# Patient Record
Sex: Female | Born: 1970 | Race: White | Hispanic: No | Marital: Married | Smoking: Never smoker
Health system: Southern US, Community
[De-identification: ages and names within clinical notes are randomized; demographics above are authoritative.]

## PROBLEM LIST (undated history)

## (undated) DIAGNOSIS — E785 Hyperlipidemia, unspecified: Secondary | ICD-10-CM

## (undated) DIAGNOSIS — M5416 Radiculopathy, lumbar region: Secondary | ICD-10-CM

## (undated) DIAGNOSIS — E539 Vitamin B deficiency, unspecified: Secondary | ICD-10-CM

## (undated) DIAGNOSIS — D649 Anemia, unspecified: Secondary | ICD-10-CM

## (undated) DIAGNOSIS — N809 Endometriosis, unspecified: Secondary | ICD-10-CM

## (undated) DIAGNOSIS — M2550 Pain in unspecified joint: Secondary | ICD-10-CM

## (undated) DIAGNOSIS — R9230 Dense breasts, unspecified: Secondary | ICD-10-CM

## (undated) DIAGNOSIS — R739 Hyperglycemia, unspecified: Secondary | ICD-10-CM

## (undated) DIAGNOSIS — M1990 Unspecified osteoarthritis, unspecified site: Secondary | ICD-10-CM

## (undated) DIAGNOSIS — N393 Stress incontinence (female) (male): Secondary | ICD-10-CM

## (undated) DIAGNOSIS — B019 Varicella without complication: Secondary | ICD-10-CM

## (undated) HISTORY — DX: Endometriosis, unspecified: N80.9

## (undated) HISTORY — PX: REFRACTIVE SURGERY: SHX103

## (undated) HISTORY — DX: Stress incontinence (female) (male): N39.3

## (undated) HISTORY — DX: Vitamin B deficiency, unspecified: E53.9

## (undated) HISTORY — DX: Pain in unspecified joint: M25.50

## (undated) HISTORY — DX: Varicella without complication: B01.9

## (undated) HISTORY — DX: Anemia, unspecified: D64.9

## (undated) HISTORY — DX: Hyperlipidemia, unspecified: E78.5

## (undated) HISTORY — DX: Unspecified osteoarthritis, unspecified site: M19.90

## (undated) HISTORY — DX: Radiculopathy, lumbar region: M54.16

## (undated) HISTORY — PX: OTHER SURGICAL HISTORY: SHX169

## (undated) HISTORY — DX: Dense breasts, unspecified: R92.30

## (undated) HISTORY — DX: Hyperglycemia, unspecified: R73.9

## (undated) HISTORY — PX: SCLEROTHERAPY: SHX6841

## (undated) HISTORY — PX: ADENOIDECTOMY: SUR15

---

## 1998-04-11 ENCOUNTER — Other Ambulatory Visit: Admission: RE | Admit: 1998-04-11 | Discharge: 1998-04-11 | Payer: Self-pay | Admitting: *Deleted

## 1999-05-01 ENCOUNTER — Other Ambulatory Visit: Admission: RE | Admit: 1999-05-01 | Discharge: 1999-05-01 | Payer: Self-pay | Admitting: *Deleted

## 2000-04-30 ENCOUNTER — Other Ambulatory Visit: Admission: RE | Admit: 2000-04-30 | Discharge: 2000-04-30 | Payer: Self-pay | Admitting: *Deleted

## 2001-05-12 ENCOUNTER — Other Ambulatory Visit: Admission: RE | Admit: 2001-05-12 | Discharge: 2001-05-12 | Payer: Self-pay | Admitting: Obstetrics and Gynecology

## 2002-01-13 ENCOUNTER — Other Ambulatory Visit: Admission: RE | Admit: 2002-01-13 | Discharge: 2002-01-13 | Payer: Self-pay | Admitting: Obstetrics and Gynecology

## 2005-03-30 ENCOUNTER — Inpatient Hospital Stay (HOSPITAL_COMMUNITY): Admission: AD | Admit: 2005-03-30 | Discharge: 2005-04-01 | Payer: Self-pay | Admitting: Obstetrics and Gynecology

## 2005-04-02 ENCOUNTER — Encounter: Admission: RE | Admit: 2005-04-02 | Discharge: 2005-04-29 | Payer: Self-pay | Admitting: *Deleted

## 2005-04-30 ENCOUNTER — Encounter: Admission: RE | Admit: 2005-04-30 | Discharge: 2005-05-30 | Payer: Self-pay | Admitting: *Deleted

## 2005-05-31 ENCOUNTER — Encounter: Admission: RE | Admit: 2005-05-31 | Discharge: 2005-06-29 | Payer: Self-pay | Admitting: *Deleted

## 2005-06-30 ENCOUNTER — Encounter: Admission: RE | Admit: 2005-06-30 | Discharge: 2005-07-04 | Payer: Self-pay | Admitting: *Deleted

## 2005-09-26 ENCOUNTER — Ambulatory Visit: Payer: Self-pay | Admitting: Family Medicine

## 2005-10-01 ENCOUNTER — Ambulatory Visit: Payer: Self-pay | Admitting: Family Medicine

## 2014-07-28 ENCOUNTER — Ambulatory Visit: Payer: Self-pay | Admitting: Podiatry

## 2014-07-31 ENCOUNTER — Ambulatory Visit (INDEPENDENT_AMBULATORY_CARE_PROVIDER_SITE_OTHER): Payer: 59

## 2014-07-31 ENCOUNTER — Ambulatory Visit (INDEPENDENT_AMBULATORY_CARE_PROVIDER_SITE_OTHER): Payer: 59 | Admitting: Podiatry

## 2014-07-31 ENCOUNTER — Telehealth: Payer: Self-pay | Admitting: *Deleted

## 2014-07-31 VITALS — BP 122/78 | HR 74 | Resp 15

## 2014-07-31 DIAGNOSIS — M79672 Pain in left foot: Secondary | ICD-10-CM

## 2014-07-31 DIAGNOSIS — B079 Viral wart, unspecified: Secondary | ICD-10-CM | POA: Diagnosis not present

## 2014-07-31 DIAGNOSIS — B078 Other viral warts: Secondary | ICD-10-CM

## 2014-07-31 NOTE — Progress Notes (Signed)
Subjective:     Patient ID: Emily Weaver, female   DOB: 09-08-1970, 44 y.o.   MRN: [id number]  HPI patient has a painful lesion plantar aspect left foot that she thinks might be a foreign body and that she may have stepped on a splinter. States it's very tender and makes it hard for her to walk and it's been there several months and seems to feel like she's walking on a rock   Review of Systems  All other systems reviewed and are negative.      Objective:   Physical Exam  Constitutional: She is oriented to person, place, and time.  Cardiovascular: Intact distal pulses.   Musculoskeletal: Normal range of motion.  Neurological: She is oriented to person, place, and time.  Skin: Skin is warm.  Nursing note and vitals reviewed.  neurovascular status intact muscle strength adequate with range of motion subtalar midtarsal joint within normal limits. Patient's noted to have good digital perfusion is well oriented 3 and on the plantar aspect of the left foot there is a mass lying around the fourth metatarsal that upon debridement shows pinpoint bleeding pain to lateral pressure and measures approximately 1.1 cm x 7 mm.     Assessment:     Probable verruca plantaris plantar aspect left foot with possibility for porokeratotic lesion or plantar callus    Plan:     H&P and condition reviewed with patient. Due to the characteristic look I've recommended excision of mass and explained the risk of procedure and she wants to go ahead. I infiltrated with 60 mg of Xylocaine with epinephrine and after appropriate sterile prep of the area using sharp instrumentation I removed the lesion in toto and applied a small amount of phenol to the base to kill any remaining cells. I applied sterile dressing and did send off for pathology and will reappoint if any issues should occur but I explained will take several weeks to heal

## 2014-07-31 NOTE — Progress Notes (Signed)
   Subjective:    Patient ID: Emily Weaver, female    DOB: 1970-10-15, 44 y.o.   MRN: [id number]  HPI  Pt presents with left foot pain, presents as a plantar wart but pt thinks there is a foreign object in her foot. It is very painful and somewhat swollen, states this happened 1 month ago  Review of Systems  All other systems reviewed and are negative.      Objective:   Physical Exam        Assessment & Plan:

## 2014-07-31 NOTE — Telephone Encounter (Signed)
Left skin wedge sent to [facility] diagnosing definitive lesion.

## 2014-07-31 NOTE — Addendum Note (Signed)
Addended by: Harriett Sine D on: 07/31/2014 02:44 PM   Modules accepted: Orders

## 2014-07-31 NOTE — Patient Instructions (Signed)

## 2014-08-16 ENCOUNTER — Telehealth: Payer: Self-pay | Admitting: *Deleted

## 2014-08-16 NOTE — Telephone Encounter (Signed)
Dr. Paulla Dolly states pt's Bako biopsy of 07/31/2014 is a plantar wart.  I informed pt of results and instructed her to continue her aftercare until the area has a dry hard scab without redness, or drainage, and to call with concerns.

## 2014-08-29 ENCOUNTER — Encounter: Payer: Self-pay | Admitting: Podiatry

## 2016-03-19 ENCOUNTER — Other Ambulatory Visit: Payer: Self-pay | Admitting: Rheumatology

## 2016-03-19 DIAGNOSIS — M545 Low back pain: Secondary | ICD-10-CM

## 2016-03-28 ENCOUNTER — Other Ambulatory Visit: Payer: Self-pay | Admitting: Rheumatology

## 2016-03-28 ENCOUNTER — Ambulatory Visit
Admission: RE | Admit: 2016-03-28 | Discharge: 2016-03-28 | Disposition: A | Discharge status: Home / Self Care | Payer: 59 | Source: Ambulatory Visit | Attending: Rheumatology | Admitting: Rheumatology

## 2016-03-28 DIAGNOSIS — M545 Low back pain: Secondary | ICD-10-CM

## 2016-03-31 ENCOUNTER — Other Ambulatory Visit: Payer: Self-pay | Admitting: Obstetrics & Gynecology

## 2016-04-01 ENCOUNTER — Encounter: Payer: Self-pay | Admitting: Obstetrics & Gynecology

## 2016-04-02 ENCOUNTER — Other Ambulatory Visit: Payer: Self-pay | Admitting: Rheumatology

## 2016-04-17 ENCOUNTER — Ambulatory Visit
Admission: RE | Admit: 2016-04-17 | Discharge: 2016-04-17 | Disposition: A | Discharge status: Home / Self Care | Payer: 59 | Source: Ambulatory Visit | Attending: Rheumatology | Admitting: Rheumatology

## 2016-04-17 DIAGNOSIS — M545 Low back pain: Secondary | ICD-10-CM

## 2016-05-22 ENCOUNTER — Ambulatory Visit (INDEPENDENT_AMBULATORY_CARE_PROVIDER_SITE_OTHER): Payer: 59 | Admitting: Obstetrics & Gynecology

## 2016-05-22 ENCOUNTER — Encounter: Payer: Self-pay | Admitting: Obstetrics & Gynecology

## 2016-05-22 VITALS — BP 116/70 | Ht 64.0 in | Wt 160.0 lb

## 2016-05-22 DIAGNOSIS — N83299 Other ovarian cyst, unspecified side: Secondary | ICD-10-CM | POA: Diagnosis not present

## 2016-05-22 DIAGNOSIS — Z01818 Encounter for other preprocedural examination: Secondary | ICD-10-CM | POA: Diagnosis not present

## 2016-05-22 NOTE — Patient Instructions (Signed)
Preop visit today.  Planning Robotic Left Oophorectomy with Bilateral Salpingectomy.  Peritoneal washings.  Surgery/risks/post op recovery discussed.  Juliann Pulse will call you with Operative dates.

## 2016-05-22 NOTE — Progress Notes (Signed)
    Emily Weaver May 07, 1970 [id number]        46 y.o.  [name] on Mirena IUD presenting for Preop Lt complex Ovarian cyst.    Ovarian lesion was originally seen on an MRI 03/28/2016, done for lower back pain with radiation.  Possible Spinal stenosis.  Past medical history,surgical history, problem list, medications, allergies, family history and social history were all reviewed and documented in the EPIC chart.  Directed ROS with pertinent positives and negatives documented in the history of present illness/assessment and plan.  Exam:  Vitals:   05/22/16 0852  BP: 116/70  Weight: 160 lb (72.6 kg)  Height: 5\' 4"  (1.626 m)   General appearance:  Normal   Assessment/Plan:  46 y.o. [name] non menopausal with Mirena IUD in place, with a Complex Left Ovarian mass 4.9 cm.  Ova 1 low risk at 4.7, Ca125 slightly increased at 41.  Probable benign tumor, possible Endometrioma, but malignant process not completely ruled out.  Decision to proceed with Robotic Lt Oophorectomy, Bilateral Salpingectomy, removal of IUD and Peritoneal washings.                         Patient was counseled as to the risk of surgery to include the following:  1. Infection (prohylactic antibiotics will be administered)  2. DVT/Pulmonary Embolism (prophylactic pneumo compression stockings will be used)  3.Trauma to internal organs requiring additional surgical procedure to repair any injury to     Internal organs requiring perhaps additional hospitalization days.  4.Hemmorhage requiring transfusion and blood products which carry risks such as anaphylactic reaction, hepatitis and AIDS  Patient had received literature information on the procedure scheduled and all her questions were answered and fully accepts all risk.   Emily [id number]:17 AMTD

## 2016-06-10 ENCOUNTER — Encounter (HOSPITAL_COMMUNITY)
Admission: RE | Admit: 2016-06-10 | Discharge: 2016-06-10 | Disposition: A | Discharge status: Home / Self Care | Payer: 59 | Source: Ambulatory Visit | Attending: Obstetrics & Gynecology | Admitting: Obstetrics & Gynecology

## 2016-06-10 ENCOUNTER — Encounter (HOSPITAL_COMMUNITY): Payer: Self-pay

## 2016-06-10 DIAGNOSIS — Z01812 Encounter for preprocedural laboratory examination: Secondary | ICD-10-CM | POA: Insufficient documentation

## 2016-06-10 LAB — CBC
HEMATOCRIT: 39.5 % (ref 36.0–46.0)
Hemoglobin: 13.4 g/dL (ref 12.0–15.0)
MCH: 32.1 pg (ref 26.0–34.0)
MCHC: 33.9 g/dL (ref 30.0–36.0)
MCV: 94.5 fL (ref 78.0–100.0)
Platelets: 330 10*3/uL (ref 150–400)
RBC: 4.18 MIL/uL (ref 3.87–5.11)
RDW: 13.8 % (ref 11.5–15.5)
WBC: 9 10*3/uL (ref 4.0–10.5)

## 2016-06-10 LAB — TYPE AND SCREEN
ABO/RH(D): A POS
Antibody Screen: NEGATIVE

## 2016-06-10 LAB — ABO/RH: ABO/RH(D): A POS

## 2016-06-10 NOTE — Patient Instructions (Addendum)
Your procedure is scheduled on:  Friday, Jun 20, 2016  Enter through the [employer] [facility] at:  6:45 AM  Pick up the phone at the desk and dial [phone].  Call this number if you have problems the morning of surgery: [phone].  Remember: Do NOT eat food or drink after:  Midnight Thursday  Take these medicines the morning of surgery with a SIP OF WATER:  None  Stop ALL herbal medications at this time  Do NOT smoke the day of surgery.  Do NOT wear jewelry (body piercing), metal hair clips/bobby pins, make-up, or nail polish. Do NOT wear lotions, powders, or perfumes.  You may wear deodorant. Do NOT shave for 48 hours prior to surgery. Do NOT bring valuables to the hospital. Contacts, dentures, or bridgework may not be worn into surgery.  Have a responsible adult drive you home and stay with you for 24 hours after your procedure  Bring a copy of your healthcare power of attorney and living will documents.

## 2016-06-19 NOTE — Anesthesia Preprocedure Evaluation (Addendum)
Anesthesia Evaluation  Patient identified by MRN, date of birth, ID band Patient awake    Reviewed: Allergy & Precautions, NPO status , Patient's Chart, lab work & pertinent test results  History of Anesthesia Complications Negative for: history of anesthetic complications  Airway Mallampati: II  TM Distance: <3 FB Neck ROM: Full    Dental  (+) Teeth Intact, Dental Advisory Given   Pulmonary neg pulmonary ROS,    Pulmonary exam normal breath sounds clear to auscultation       Cardiovascular negative cardio ROS Normal cardiovascular exam Rhythm:Regular Rate:Normal     Neuro/Psych negative neurological ROS     GI/Hepatic negative GI ROS, Neg liver ROS,   Endo/Other  negative endocrine ROS  Renal/GU negative Renal ROS     Musculoskeletal negative musculoskeletal ROS (+)   Abdominal   Peds  Hematology negative hematology ROS (+)   Anesthesia Other Findings Day of surgery medications reviewed with the patient.  Reproductive/Obstetrics Complex Left Ovarian mass 4.9 cm                            Anesthesia Physical Anesthesia Plan  ASA: II  Anesthesia Plan: General   Post-op Pain Management:    Induction: Intravenous  Airway Management Planned: Oral ETT  Additional Equipment:   Intra-op Plan:   Post-operative Plan: Extubation in OR  Informed Consent: I have reviewed the patients History and Physical, chart, labs and discussed the procedure including the risks, benefits and alternatives for the proposed anesthesia with the patient or authorized representative who has indicated his/her understanding and acceptance.   Dental advisory given  Plan Discussed with: CRNA  Anesthesia Plan Comments:         Anesthesia Quick Evaluation

## 2016-06-20 ENCOUNTER — Encounter (HOSPITAL_COMMUNITY): Payer: Self-pay | Admitting: Anesthesiology

## 2016-06-20 ENCOUNTER — Ambulatory Visit (HOSPITAL_COMMUNITY): Payer: 59 | Admitting: Anesthesiology

## 2016-06-20 ENCOUNTER — Encounter (HOSPITAL_COMMUNITY)
Admission: RE | Disposition: A | Discharge status: Home / Self Care | Payer: Self-pay | Source: Ambulatory Visit | Attending: Obstetrics & Gynecology

## 2016-06-20 ENCOUNTER — Ambulatory Visit (HOSPITAL_COMMUNITY)
Admission: RE | Admit: 2016-06-20 | Discharge: 2016-06-20 | Disposition: A | Discharge status: Home / Self Care | Payer: 59 | Source: Ambulatory Visit | Attending: Obstetrics & Gynecology | Admitting: Obstetrics & Gynecology

## 2016-06-20 DIAGNOSIS — N83292 Other ovarian cyst, left side: Secondary | ICD-10-CM | POA: Diagnosis not present

## 2016-06-20 DIAGNOSIS — Z79899 Other long term (current) drug therapy: Secondary | ICD-10-CM | POA: Diagnosis not present

## 2016-06-20 DIAGNOSIS — Z302 Encounter for sterilization: Secondary | ICD-10-CM | POA: Insufficient documentation

## 2016-06-20 DIAGNOSIS — N802 Endometriosis of fallopian tube: Secondary | ICD-10-CM | POA: Diagnosis not present

## 2016-06-20 DIAGNOSIS — N83202 Unspecified ovarian cyst, left side: Secondary | ICD-10-CM | POA: Diagnosis present

## 2016-06-20 DIAGNOSIS — D252 Subserosal leiomyoma of uterus: Secondary | ICD-10-CM | POA: Insufficient documentation

## 2016-06-20 DIAGNOSIS — N7011 Chronic salpingitis: Secondary | ICD-10-CM | POA: Insufficient documentation

## 2016-06-20 DIAGNOSIS — N736 Female pelvic peritoneal adhesions (postinfective): Secondary | ICD-10-CM | POA: Diagnosis not present

## 2016-06-20 DIAGNOSIS — N801 Endometriosis of ovary: Secondary | ICD-10-CM | POA: Diagnosis not present

## 2016-06-20 DIAGNOSIS — N803 Endometriosis of pelvic peritoneum: Secondary | ICD-10-CM | POA: Diagnosis not present

## 2016-06-20 DIAGNOSIS — Z30432 Encounter for removal of intrauterine contraceptive device: Secondary | ICD-10-CM | POA: Diagnosis not present

## 2016-06-20 HISTORY — PX: CYSTOSCOPY: SHX5120

## 2016-06-20 HISTORY — PX: IUD REMOVAL: SHX5392

## 2016-06-20 HISTORY — PX: ROBOTIC ASSISTED BILATERAL SALPINGO OOPHERECTOMY: SHX6078

## 2016-06-20 LAB — PREGNANCY, URINE: PREG TEST UR: NEGATIVE

## 2016-06-20 SURGERY — ROBOTIC ASSISTED BILATERAL SALPINGO OOPHORECTOMY
Anesthesia: General | Site: Vagina

## 2016-06-20 MED ORDER — SCOPOLAMINE 1 MG/3DAYS TD PT72
MEDICATED_PATCH | TRANSDERMAL | Status: AC
  Administered 2016-06-20: 1.5 mg via TRANSDERMAL
  Filled 2016-06-20: qty 1

## 2016-06-20 MED ORDER — LIDOCAINE HCL (CARDIAC) 20 MG/ML IV SOLN
INTRAVENOUS | Status: DC | PRN
  Administered 2016-06-20: 80 mg via INTRAVENOUS

## 2016-06-20 MED ORDER — MIDAZOLAM HCL 2 MG/2ML IJ SOLN
INTRAMUSCULAR | Status: AC
  Filled 2016-06-20: qty 2

## 2016-06-20 MED ORDER — PROPOFOL 10 MG/ML IV BOLUS
INTRAVENOUS | Status: DC | PRN
  Administered 2016-06-20: 150 mg via INTRAVENOUS

## 2016-06-20 MED ORDER — CEFOTETAN DISODIUM-DEXTROSE 2-2.08 GM-% IV SOLR
2.0000 g | INTRAVENOUS | Status: AC
  Administered 2016-06-20: 2 g via INTRAVENOUS

## 2016-06-20 MED ORDER — GABAPENTIN 300 MG PO CAPS
ORAL_CAPSULE | ORAL | Status: AC
  Administered 2016-06-20: 300 mg via ORAL
  Filled 2016-06-20: qty 1

## 2016-06-20 MED ORDER — ROCURONIUM BROMIDE 100 MG/10ML IV SOLN
INTRAVENOUS | Status: AC
  Filled 2016-06-20: qty 1

## 2016-06-20 MED ORDER — FENTANYL CITRATE (PF) 100 MCG/2ML IJ SOLN
INTRAMUSCULAR | Status: AC
Start: 2016-06-20 — End: ?
  Filled 2016-06-20: qty 2

## 2016-06-20 MED ORDER — CEFOTETAN DISODIUM-DEXTROSE 2-2.08 GM-% IV SOLR
INTRAVENOUS | Status: AC
  Filled 2016-06-20: qty 50

## 2016-06-20 MED ORDER — GABAPENTIN 300 MG PO CAPS
300.0000 mg | ORAL_CAPSULE | Freq: Once | ORAL | Status: AC
  Administered 2016-06-20: 300 mg via ORAL

## 2016-06-20 MED ORDER — SODIUM CHLORIDE 0.9 % IJ SOLN
INTRAMUSCULAR | Status: AC
  Filled 2016-06-20: qty 50

## 2016-06-20 MED ORDER — ACETAMINOPHEN 500 MG PO TABS
ORAL_TABLET | ORAL | Status: AC
  Administered 2016-06-20: 1000 mg via ORAL
  Filled 2016-06-20: qty 2

## 2016-06-20 MED ORDER — FENTANYL CITRATE (PF) 250 MCG/5ML IJ SOLN
INTRAMUSCULAR | Status: AC
  Filled 2016-06-20: qty 5

## 2016-06-20 MED ORDER — LIDOCAINE HCL 1 % IJ SOLN
INTRAMUSCULAR | Status: AC
  Filled 2016-06-20: qty 20

## 2016-06-20 MED ORDER — LACTATED RINGERS IV SOLN
INTRAVENOUS | Status: DC
  Administered 2016-06-20: 125 mL/h via INTRAVENOUS
  Administered 2016-06-20 (×2): via INTRAVENOUS

## 2016-06-20 MED ORDER — KETOROLAC TROMETHAMINE 30 MG/ML IJ SOLN
INTRAMUSCULAR | Status: AC
  Filled 2016-06-20: qty 1

## 2016-06-20 MED ORDER — SUGAMMADEX SODIUM 200 MG/2ML IV SOLN
INTRAVENOUS | Status: DC | PRN
  Administered 2016-06-20: 140 mg via INTRAVENOUS

## 2016-06-20 MED ORDER — PROPOFOL 10 MG/ML IV BOLUS
INTRAVENOUS | Status: AC
  Filled 2016-06-20: qty 20

## 2016-06-20 MED ORDER — FENTANYL CITRATE (PF) 100 MCG/2ML IJ SOLN
INTRAMUSCULAR | Status: AC
  Administered 2016-06-20: 50 ug via INTRAVENOUS
  Filled 2016-06-20: qty 2

## 2016-06-20 MED ORDER — ROCURONIUM BROMIDE 100 MG/10ML IV SOLN
INTRAVENOUS | Status: DC | PRN
  Administered 2016-06-20: 10 mg via INTRAVENOUS
  Administered 2016-06-20: 20 mg via INTRAVENOUS
  Administered 2016-06-20: 50 mg via INTRAVENOUS
  Administered 2016-06-20: 10 mg via INTRAVENOUS
  Administered 2016-06-20: 20 mg via INTRAVENOUS
  Administered 2016-06-20: 10 mg via INTRAVENOUS

## 2016-06-20 MED ORDER — SUGAMMADEX SODIUM 200 MG/2ML IV SOLN
INTRAVENOUS | Status: AC
  Filled 2016-06-20: qty 2

## 2016-06-20 MED ORDER — ONDANSETRON HCL 4 MG/2ML IJ SOLN
INTRAMUSCULAR | Status: DC | PRN
  Administered 2016-06-20: 4 mg via INTRAVENOUS

## 2016-06-20 MED ORDER — HYDROMORPHONE HCL 1 MG/ML IJ SOLN
INTRAMUSCULAR | Status: DC | PRN
  Administered 2016-06-20: 1 mg via INTRAVENOUS

## 2016-06-20 MED ORDER — ARTIFICIAL TEARS OPHTHALMIC OINT
TOPICAL_OINTMENT | OPHTHALMIC | Status: AC
  Filled 2016-06-20: qty 3.5

## 2016-06-20 MED ORDER — SODIUM CHLORIDE 0.9 % IV SOLN
INTRAVENOUS | Status: DC | PRN
  Administered 2016-06-20: 60 mL

## 2016-06-20 MED ORDER — DEXAMETHASONE SODIUM PHOSPHATE 10 MG/ML IJ SOLN
INTRAMUSCULAR | Status: AC
  Filled 2016-06-20: qty 1

## 2016-06-20 MED ORDER — HYDROMORPHONE HCL 1 MG/ML IJ SOLN
INTRAMUSCULAR | Status: AC
  Filled 2016-06-20: qty 1

## 2016-06-20 MED ORDER — CELECOXIB 200 MG PO CAPS
200.0000 mg | ORAL_CAPSULE | Freq: Once | ORAL | Status: AC
  Administered 2016-06-20: 200 mg via ORAL

## 2016-06-20 MED ORDER — CELECOXIB 200 MG PO CAPS
ORAL_CAPSULE | ORAL | Status: AC
  Administered 2016-06-20: 200 mg via ORAL
  Filled 2016-06-20: qty 1

## 2016-06-20 MED ORDER — BUPIVACAINE HCL (PF) 0.25 % IJ SOLN
INTRAMUSCULAR | Status: DC | PRN
  Administered 2016-06-20: 10 mL

## 2016-06-20 MED ORDER — MIDAZOLAM HCL 5 MG/5ML IJ SOLN
INTRAMUSCULAR | Status: DC | PRN
  Administered 2016-06-20: 2 mg via INTRAVENOUS

## 2016-06-20 MED ORDER — KETOROLAC TROMETHAMINE 30 MG/ML IJ SOLN
INTRAMUSCULAR | Status: DC | PRN
  Administered 2016-06-20: 30 mg via INTRAVENOUS

## 2016-06-20 MED ORDER — ONDANSETRON HCL 4 MG/2ML IJ SOLN
INTRAMUSCULAR | Status: AC
  Filled 2016-06-20: qty 2

## 2016-06-20 MED ORDER — FENTANYL CITRATE (PF) 100 MCG/2ML IJ SOLN
25.0000 ug | INTRAMUSCULAR | Status: DC | PRN
  Administered 2016-06-20: 50 ug via INTRAVENOUS

## 2016-06-20 MED ORDER — ACETAMINOPHEN 500 MG PO TABS
1000.0000 mg | ORAL_TABLET | Freq: Once | ORAL | Status: AC
  Administered 2016-06-20: 1000 mg via ORAL

## 2016-06-20 MED ORDER — BUPIVACAINE HCL (PF) 0.25 % IJ SOLN
INTRAMUSCULAR | Status: AC
  Filled 2016-06-20: qty 30

## 2016-06-20 MED ORDER — SCOPOLAMINE 1 MG/3DAYS TD PT72
1.0000 | MEDICATED_PATCH | Freq: Once | TRANSDERMAL | Status: DC
  Administered 2016-06-20: 1.5 mg via TRANSDERMAL

## 2016-06-20 MED ORDER — LIDOCAINE HCL (CARDIAC) 20 MG/ML IV SOLN
INTRAVENOUS | Status: AC
  Filled 2016-06-20: qty 5

## 2016-06-20 MED ORDER — DEXAMETHASONE SODIUM PHOSPHATE 10 MG/ML IJ SOLN
INTRAMUSCULAR | Status: DC | PRN
  Administered 2016-06-20: 10 mg via INTRAVENOUS

## 2016-06-20 MED ORDER — METHYLENE BLUE 0.5 % INJ SOLN
INTRAVENOUS | Status: AC
  Filled 2016-06-20: qty 10

## 2016-06-20 MED ORDER — SOD CITRATE-CITRIC ACID 500-334 MG/5ML PO SOLN
ORAL | Status: AC
  Administered 2016-06-20: 30 mL via ORAL
  Filled 2016-06-20: qty 15

## 2016-06-20 MED ORDER — ROPIVACAINE HCL 5 MG/ML IJ SOLN
INTRAMUSCULAR | Status: AC
  Filled 2016-06-20: qty 30

## 2016-06-20 MED ORDER — OXYCODONE-ACETAMINOPHEN 7.5-325 MG PO TABS: 1.0000 | ORAL_TABLET | ORAL | 0 refills | Status: DC | PRN

## 2016-06-20 MED ORDER — SOD CITRATE-CITRIC ACID 500-334 MG/5ML PO SOLN
30.0000 mL | ORAL | Status: AC
  Administered 2016-06-20: 30 mL via ORAL

## 2016-06-20 MED ORDER — FENTANYL CITRATE (PF) 100 MCG/2ML IJ SOLN
INTRAMUSCULAR | Status: DC | PRN
  Administered 2016-06-20 (×3): 100 ug via INTRAVENOUS
  Administered 2016-06-20: 50 ug via INTRAVENOUS

## 2016-06-20 MED ORDER — PROMETHAZINE HCL 25 MG/ML IJ SOLN: 6.2500 mg | INTRAMUSCULAR | Status: DC | PRN

## 2016-06-20 MED ORDER — METHYLENE BLUE 0.5 % INJ SOLN
INTRAVENOUS | Status: DC | PRN
  Administered 2016-06-20 (×2): 5 mL via INTRAVENOUS

## 2016-06-20 SURGICAL SUPPLY — 64 items
ADH SKN CLS APL DERMABOND .7 (GAUZE/BANDAGES/DRESSINGS) ×3
BAG SPEC RTRVL LRG 6X4 10 (ENDOMECHANICALS) ×3
BARRIER ADHS 3X4 INTERCEED (GAUZE/BANDAGES/DRESSINGS) IMPLANT
BRR ADH 4X3 ABS CNTRL BYND (GAUZE/BANDAGES/DRESSINGS)
CATH FOLEY 3WAY  5CC 16FR (CATHETERS) ×2
CATH FOLEY 3WAY 5CC 16FR (CATHETERS) ×3 IMPLANT
CATH ROBINSON RED A/P 16FR (CATHETERS) ×3 IMPLANT
CLOTH BEACON ORANGE TIMEOUT ST (SAFETY) ×5 IMPLANT
CONT PATH 16OZ SNAP LID 3702 (MISCELLANEOUS) ×3 IMPLANT
CONTAINER PREFILL 10% NBF 60ML (FORM) ×10 IMPLANT
COVER BACK TABLE 60X90IN (DRAPES) ×10 IMPLANT
COVER TIP SHEARS 8 DVNC (MISCELLANEOUS) ×3 IMPLANT
COVER TIP SHEARS 8MM DA VINCI (MISCELLANEOUS) ×2
DECANTER SPIKE VIAL GLASS SM (MISCELLANEOUS) ×10 IMPLANT
DEFOGGER SCOPE WARMER CLEARIFY (MISCELLANEOUS) ×5 IMPLANT
DERMABOND ADVANCED (GAUZE/BANDAGES/DRESSINGS) ×2
DERMABOND ADVANCED .7 DNX12 (GAUZE/BANDAGES/DRESSINGS) ×3 IMPLANT
DRSG OPSITE POSTOP 3X4 (GAUZE/BANDAGES/DRESSINGS) ×5 IMPLANT
DURAPREP 26ML APPLICATOR (WOUND CARE) ×5 IMPLANT
ELECT REM PT RETURN 9FT ADLT (ELECTROSURGICAL) ×5
ELECTRODE REM PT RTRN 9FT ADLT (ELECTROSURGICAL) ×3 IMPLANT
GAUZE VASELINE 3X9 (GAUZE/BANDAGES/DRESSINGS) IMPLANT
GLOVE BIO SURGEON STRL SZ 6.5 (GLOVE) ×12 IMPLANT
GLOVE BIO SURGEONS STRL SZ 6.5 (GLOVE) ×3
GLOVE BIOGEL PI IND STRL 7.0 (GLOVE) ×15 IMPLANT
GLOVE BIOGEL PI INDICATOR 7.0 (GLOVE) ×10
GOWN STRL REUS W/TWL LRG LVL3 (GOWN DISPOSABLE) ×10 IMPLANT
KIT ACCESSORY DA VINCI DISP (KITS) ×2
KIT ACCESSORY DVNC DISP (KITS) ×3 IMPLANT
LEGGING LITHOTOMY PAIR STRL (DRAPES) ×7 IMPLANT
MANIPULATOR ADVINCU DEL 3.0 PL (MISCELLANEOUS) IMPLANT
MANIPULATOR ADVINCU DEL 3.5 PL (MISCELLANEOUS) IMPLANT
MANIPULATOR ADVINCU DEL 4.0 PL (MISCELLANEOUS) IMPLANT
NDL SPNL 22GX3.5 QUINCKE BK (NEEDLE) ×3 IMPLANT
NEEDLE SPNL 22GX3.5 QUINCKE BK (NEEDLE) ×5 IMPLANT
PACK ROBOT WH (CUSTOM PROCEDURE TRAY) ×5 IMPLANT
PACK ROBOTIC GOWN (GOWN DISPOSABLE) ×5 IMPLANT
PACK TRENDGUARD 450 HYBRID PRO (MISCELLANEOUS) IMPLANT
PACK TRENDGUARD 600 HYBRD PROC (MISCELLANEOUS) IMPLANT
PACK VAGINAL MINOR WOMEN LF (CUSTOM PROCEDURE TRAY) ×5 IMPLANT
PAD PREP 24X48 CUFFED NSTRL (MISCELLANEOUS) ×5 IMPLANT
POUCH SPECIMEN RETRIEVAL 10MM (ENDOMECHANICALS) ×2 IMPLANT
PROTECTOR NERVE ULNAR (MISCELLANEOUS) ×10 IMPLANT
SET CYSTO W/LG BORE CLAMP LF (SET/KITS/TRAYS/PACK) ×2 IMPLANT
SET IRRIG TUBING LAPAROSCOPIC (IRRIGATION / IRRIGATOR) ×5 IMPLANT
SET TRI-LUMEN FLTR TB AIRSEAL (TUBING) ×5 IMPLANT
SUT VIC AB 3-0 SH 27 (SUTURE)
SUT VIC AB 3-0 SH 27X BRD (SUTURE) IMPLANT
SUT VIC AB 4-0 PS2 27 (SUTURE) ×10 IMPLANT
SUT VICRYL 0 UR6 27IN ABS (SUTURE) ×5 IMPLANT
SYR 50ML LL SCALE MARK (SYRINGE) ×5 IMPLANT
SYSTEM CONVERTIBLE TROCAR (TROCAR) ×5 IMPLANT
TIP UTERINE 5.1X6CM LAV DISP (MISCELLANEOUS) ×2 IMPLANT
TIP UTERINE 6.7X10CM GRN DISP (MISCELLANEOUS) IMPLANT
TIP UTERINE 6.7X6CM WHT DISP (MISCELLANEOUS) IMPLANT
TIP UTERINE 6.7X8CM BLUE DISP (MISCELLANEOUS) ×2 IMPLANT
TOWEL OR 17X24 6PK STRL BLUE (TOWEL DISPOSABLE) ×15 IMPLANT
TRENDGUARD 450 HYBRID PRO PACK (MISCELLANEOUS)
TRENDGUARD 600 HYBRID PROC PK (MISCELLANEOUS)
TROCAR DISP BLADELESS 8 DVNC (TROCAR) ×3 IMPLANT
TROCAR DISP BLADELESS 8MM (TROCAR) ×2
TROCAR PORT AIRSEAL 5X120 (TROCAR) ×5 IMPLANT
WARMER LAPAROSCOPE (MISCELLANEOUS) ×5 IMPLANT
WATER STERILE IRR 1000ML POUR (IV SOLUTION) ×5 IMPLANT

## 2016-06-20 NOTE — Op Note (Addendum)
06/20/2016  12:45 PM  PATIENT:  Emily Weaver  46 y.o. female  PRE-OPERATIVE DIAGNOSIS:  Complex Left Ovarian Cyst, Desire for Sterilization  POST-OPERATIVE DIAGNOSIS:  Complex Left Ovarian Cyst, Endometrioma, Left Adnexal Adhesions, Moderate Pelvic Endometriosis, Desire for Sterilization  PROCEDURE:  Procedure(s): ROBOTIC ASSISTED LEFT  SALPINGO-OOPHORECTOMY WITH RIGHT SALPINGECTOMY INTRAUTERINE DEVICE (IUD) REMOVAL, LYSIS OF ADHESIONS. PERITONEAL WASHINGS. CYSTOSCOPY  SURGEON:  Surgeon(s): [name], MD  ASSISTANTS:  Beth RNFA  ANESTHESIA:   general   PROCEDURE:  Under general anesthesia with endotracheal intubation the patient is in lithotomy position. She is prepped with ChloraPrep on the abdomen and with Betadine on the suprapubic, vulvar and vaginal areas.  The patient was draped as usual.  The vaginal exam reveals a retroverted uterus normal volume, left adnexal masses present, right adnexa normal. The Foley is inserted in the bladder.  The weighted speculum is inserted in the vagina and the anterior lip of the cervix is grasped with a tenaculum.  The IUD strings are visible at the exo-cervix, they are grasped with a clamp and the IUD is easily removed intact and discarded.  The hysterometry is at 8 cm.  The insertion of the roomy #8 and 6 was unsuccessful, we therefore decided to use an Ulka canula. The tenaculum was removed from the cervix and the weighted speculum was removed as well.  Abdominally the supraumbilical area was infiltrated with Marcaine one quarter plain. A 1.5 cm incision was done with the scalpel. The aponeurosis was grasped with Coker clamps. The aponeurosis was opened with the Mayo scissors under direct vision. The parietal peritoneum was opened bluntly with the finger. A pursestring stitch of Vicryl 0 is done on the aponeurosis. The Sheryle Hail was inserted at that level under direct vision. Up pneumoperitoneum was created with CO2.  The camera was inserted  in that port.  Inspection of the abdominopelvic cavities. No pathology in the abdomen. A picture of the liver was taken.  In the pelvis we note a normal size uterus with a small subserosal myoma at the right posterior fundal aspect. Measuring about 1.5 cm.  The right tube and right ovary are normal in size and appearance.  The left adnexa presents a large cyst completely adherent to the left pelvic wall.  The posterior cul-de-sac is free of adhesions.  One small lesion of endometriosis is present at that level. The lesion of peritoneal endometriosis is present at the junction of the pelvic to abdominal wall on the right side.  Pictures are taken of all those structures.  The camera is removed.  We used a semicircular configuration for port placement.  The sites are marked with a marker.  Marcaine is infiltrated at all sites. Small incisions are made with the scalpel.  All ports are inserted under direct vision.  2 robotic ports are inserted on the right and one robotic ports on the lower left. The assistant port a 5 mm port is inserted at the upper left side.  The patient is positioned in deep Trendelenburg. The robot his docked. The robotic instruments are inserted under direct vision with an Endo Shears scissor in the first arm, a PK in the second arm and the fenestrated clamp in the third arm.  We go to the console.      Both ureters are well visualized and normal anatomy position.  We starts on the right side and proceed with cauterization and section of the right mesosalpinx.  We then cauterized and section the right tube proximally  at the junction of the uterus.  The right tube is completely detached and passed through the assistant port.  We then go to the left adnexa.  We proceed meticulously to free and exposed the left infundibulopelvic ligament.  We then cauterized and section the left infundibulopelvic ligament with clear vision of the left ureter far away from that location.  We then cauterized and  section the left tube at the junction of the uterus.  We cauterized and section the left utero-ovarian ligament.  In the process of dissection one of the endometriotic cyst ruptures and  exposes the typical chocolate thick fluid.  We continued to dissect the ovary from the left pelvic wall always staying safely on the ovarian tissue.  The left ovary and left tube were completely separated from the left pelvic wall and put in the posterior cul-de-sac. Hemostasis was adequate at all levels.  Good peristalsis of both ureters was seen at the end of the procedure.  But given the extensive lysis of adhesions required, the decision was made to confirm the integrity of the ureters by cystoscopy.  The specimen was grasped with an atraumatic clamp by the assistant using the assistant port.  All robotic instruments were removed. The robot was undocked. And we went to laparoscopy time.      We switched to an 8 mm camera and a robotic ports and use the Endobag in the [place] port to remove the specimen. The left ovary and left tube as well as the right tube were sent together to pathology.  Hemostasis was evaluated once more and found adequate.  Irrigation and suction of the pelvic cavity.  We poured bupivacaine in the pelvic cavity.  All laparoscopic instruments were removed.  The ports were left in place during cystoscopy and the abdomen was covered with a sterile drape.      Methylene blue was given IV.  The Foley was removed. Cystoscopy was performed.  The bladder wall was intact. No bleeding was present. Very good ureteral jets were visualized on the left and right side.  The cystoscope was therefore removed.  The Foley was reinserted in the bladder.      We then went to the abdomen, evacuated the CO2 and removed all ports.  The supraumbilical incision was closed by attaching the pursestring stitch.  We then put a subcuticular stitch of Vicryl 4-0 on all incisions. Dermabond was added on all incisions. Hemostasis was  adequate at all levels.      The patient was brought to recovery room in good and stable status.  ESTIMATED BLOOD LOSS: 50 cc   Intake/Output Summary (Last 24 hours) at 06/20/16 1245 Last data filed at 06/20/16 1104  Gross per 24 hour  Intake             2000 ml  Output              200 ml  Net             1800 ml     BLOOD ADMINISTERED:none   LOCAL MEDICATIONS USED:  MARCAINE    and BUPIVICAINE   SPECIMEN:  Source of Specimen:  Left Ovary, Bilateral Tubes  DISPOSITION OF SPECIMEN:  PATHOLOGY  COUNTS:  YES  PLAN OF CARE: Transfer to PACU   [name] MD  06/20/2016 at 12:49 pm

## 2016-06-20 NOTE — Anesthesia Postprocedure Evaluation (Signed)
Anesthesia Post Note  Patient: Saffron Busey  Procedure(s) Performed: Procedure(s) (LRB): ROBOTIC ASSISTED LEFT  SALPINGO-OOPHORECTOMY WITH RIGHT SALPINGECTOMY (Bilateral) INTRAUTERINE DEVICE (IUD) REMOVAL (N/A) CYSTOSCOPY (N/A)  Patient location during evaluation: PACU Anesthesia Type: General Level of consciousness: awake and alert Pain management: pain level controlled Vital Signs Assessment: post-procedure vital signs reviewed and stable Respiratory status: spontaneous breathing, nonlabored ventilation, respiratory function stable and patient connected to nasal cannula oxygen Cardiovascular status: blood pressure returned to baseline and stable Postop Assessment: no signs of nausea or vomiting Anesthetic complications: no        Last Vitals:  Vitals:   06/20/16 1300 06/20/16 1315  BP: 110/69 113/65  Pulse: 63 77  Resp: 16 17  Temp: 36.6 C     Last Pain:  Vitals:   06/20/16 1315  TempSrc:   PainSc: 0-No pain   Pain Goal: Patients Stated Pain Goal: 3 (06/20/16 1217)               Catalina Gravel

## 2016-06-20 NOTE — Transfer of Care (Signed)
Immediate Anesthesia Transfer of Care Note  Patient: Rilie Glanz  Procedure(s) Performed: Procedure(s) with comments: ROBOTIC ASSISTED LEFT  SALPINGO-OOPHORECTOMY WITH RIGHT SALPINGECTOMY (Bilateral) -     With pelvic washings   INTRAUTERINE DEVICE (IUD) REMOVAL (N/A) - BETH TO RNFA confirmed with chassity and kathy on 05/23/16  CYSTOSCOPY (N/A)  Patient Location: PACU  Anesthesia Type:General  Level of Consciousness: sedated  Airway & Oxygen Therapy: Patient Spontanous Breathing and Patient connected to nasal cannula oxygen  Post-op Assessment: Report given to RN and Post -op Vital signs reviewed and stable  Post vital signs: Reviewed and stable  Last Vitals:  Vitals:   06/20/16 0647  BP: 123/66  Pulse: 83  Resp: 16  Temp: 36.6 C    Last Pain:  Vitals:   06/20/16 0647  TempSrc: Oral      Patients Stated Pain Goal: 4 (30/94/07 6808)  Complications: No apparent anesthesia complications

## 2016-06-20 NOTE — Discharge Summary (Signed)
Emily Weaver 1970-10-13 [id number]   Discharge Summary  Date of Admission:  06/20/2016  Date of Discharge:  06/20/2016  Discharge Diagnosis:  Left Ovarian Endometrioma, Left Adnexal Adhesions, Moderate Pelvic Endometriosis, Sterilization  Procedure:  Procedure(s): ROBOTIC ASSISTED LEFT  SALPINGO-OOPHORECTOMY WITH RIGHT SALPINGECTOMY INTRAUTERINE DEVICE (IUD) REMOVAL.  LYSIS OF ADHESIONS.  PERITONEAL WASHINGS. CYSTOSCOPY  Pathology: Left Tube and Ovary, Right Tube  Hospital Course:  Good.  The patient received instructions for postoperative care and call precautions.  She received prescriptions per AVS and will be seen in the office 3 weeks following discharge.    [name] MD, 1:22 PM 06/20/2016

## 2016-06-20 NOTE — Discharge Instructions (Signed)

## 2016-06-20 NOTE — H&P (Signed)
Emily Weaver is an 46 y.o. female. [name] on Mirena IUD  RP: Surgery for Lt complex Ovarian cyst.    HPI:  Ovarian lesion was originally seen on an MRI 03/28/2016, done for lower back pain with radiation.  Possible Spinal stenosis.  Pertinent Gynecological History: Menses: flow is light Contraception: IUD Blood transfusions: none Sexually transmitted diseases: no past history Last mammogram: normal  Last pap: normal OB History: G2, P2   Menstrual History:  Patient's last menstrual period was 06/02/2016 (approximate).    No past medical history on file.  Past Surgical History:  Procedure Laterality Date  . lymph node removal     age 4 years old due to cat scratch fever  . REFRACTIVE SURGERY      Family History  Problem Relation Age of Onset  . Breast cancer Mother     Social History:  reports that she has never smoked. She has never used smokeless tobacco. She reports that she drinks alcohol. She reports that she does not use drugs.  Allergies: No Known Allergies  Prescriptions Prior to Admission  Medication Sig Dispense Refill Last Dose  . Cyanocobalamin (B-12) 1000 MCG SUBL Place 1,000 mcg under the tongue daily.   06/19/2016 at Unknown time  . Melatonin 5 MG TABS Take 5 mg by mouth at bedtime as needed (SLEEP).   Past Week at Unknown time  . Multiple Vitamin (MULTIVITAMIN) tablet Take 1 tablet by mouth daily.   06/19/2016 at Unknown time  . naproxen sodium (ANAPROX) 220 MG tablet Take 440 mg by mouth daily as needed (PAIN).   06/19/2016 at Unknown time    ROS  Blood pressure 123/66, pulse 83, temperature 97.9 F (36.6 C), temperature source Oral, resp. rate 16, last menstrual period 06/02/2016, SpO2 99 %. Physical Exam  Results for orders placed or performed during the hospital encounter of 06/20/16 (from the past 24 hour(s))  Pregnancy, urine     Status: None   Collection Time: 06/20/16  7:00 AM  Result Value Ref Range   Preg Test, Ur NEGATIVE NEGATIVE     No results found.  Assessment/Plan: Complex Left Ovarian mass 4.9 cm.  Ova 1 low risk at 4.7, Ca125 slightly increased at 41.  Probable benign tumor, possible Endometrioma, but malignant process not completely ruled out.  Decision to proceed with Robotic Lt Oophorectomy, Bilateral Salpingectomy, removal of IUD and Peritoneal washings.  Surgery and risks thoroughly reviewed with patient who agrees with plan.  Informed consent signed.  Emily Weaver 06/20/2016, 7:40 AM

## 2016-06-20 NOTE — Anesthesia Procedure Notes (Signed)
Procedure Name: Intubation Date/Time: 06/20/2016 8:07 AM Performed by: Riki Sheer Pre-anesthesia Checklist: Patient identified, Emergency Drugs available, Suction available, Patient being monitored and Timeout performed Patient Re-evaluated:Patient Re-evaluated prior to inductionOxygen Delivery Method: Circle system utilized Preoxygenation: Pre-oxygenation with 100% oxygen Intubation Type: IV induction Ventilation: Mask ventilation without difficulty Laryngoscope Size: Miller and 2 Grade View: Grade I Tube type: Oral Tube size: 7.0 mm Number of attempts: 1 Airway Equipment and Method: Stylet Placement Confirmation: ETT inserted through vocal cords under direct vision,  positive ETCO2,  CO2 detector and breath sounds checked- equal and bilateral Secured at: 22 cm Tube secured with: Tape Dental Injury: Teeth and Oropharynx as per pre-operative assessment

## 2016-06-23 ENCOUNTER — Encounter (HOSPITAL_COMMUNITY): Payer: Self-pay | Admitting: Obstetrics & Gynecology

## 2016-06-24 ENCOUNTER — Telehealth: Payer: Self-pay | Admitting: *Deleted

## 2016-06-24 NOTE — Telephone Encounter (Signed)
TC to pt for surgery follow up call. SHe states she is doing well with no concerns. She did state she has not removed the honeycomb bandage over her umbillical incision. Is this ok to remove today? Her post op is scheduled for June 1st. KW CMA

## 2016-06-25 NOTE — Telephone Encounter (Signed)
Yes, remove Honeycomb on umbilical incision today.  Thanks.

## 2016-06-25 NOTE — Telephone Encounter (Signed)
Lm for pt to remove bandage. And to call if any other concerns before post op 07/11/16. KW

## 2016-07-11 ENCOUNTER — Encounter: Payer: Self-pay | Admitting: Obstetrics & Gynecology

## 2016-07-11 ENCOUNTER — Ambulatory Visit (INDEPENDENT_AMBULATORY_CARE_PROVIDER_SITE_OTHER): Payer: 59 | Admitting: Obstetrics & Gynecology

## 2016-07-11 VITALS — BP 130/82

## 2016-07-11 DIAGNOSIS — N803 Endometriosis of pelvic peritoneum: Secondary | ICD-10-CM | POA: Diagnosis not present

## 2016-07-11 DIAGNOSIS — M545 Low back pain: Secondary | ICD-10-CM

## 2016-07-11 DIAGNOSIS — G8929 Other chronic pain: Secondary | ICD-10-CM | POA: Diagnosis not present

## 2016-07-11 DIAGNOSIS — Z09 Encounter for follow-up examination after completed treatment for conditions other than malignant neoplasm: Secondary | ICD-10-CM

## 2016-07-11 DIAGNOSIS — IMO0002 Reserved for concepts with insufficient information to code with codable children: Secondary | ICD-10-CM

## 2016-07-11 MED ORDER — NORETHINDRONE 0.35 MG PO TABS: 1.0000 | ORAL_TABLET | Freq: Every day | ORAL | 4 refills | Status: DC

## 2016-07-11 NOTE — Progress Notes (Signed)
    Amina Menchaca [birth year] [id number]        46 y.o.  [name]   Robotic Left Oophorectomy, Bilateral Salpingectomies, LOA, treatment of moderate pelvic Endometriosis on  06/20/2016.  RP:  Post op visit/Endometriosis  HPI:  Had a good postop, but lower back pain didn't resolve with the removal of the Left Ovary containing Endometriomas.  No abnormal bleeding.  No pelvic pain.  No fever.  Incisions healing well.  No UTI Sx.  Past medical history,surgical history, problem list, medications, allergies, family history and social history were all reviewed and documented in the EPIC chart.  Directed ROS with pertinent positives and negatives documented in the history of present illness/assessment and plan.  Exam:  Vitals:   07/11/16 1102  BP: 130/82   General appearance:  Normal  Abdo:  Soft, NT            Incisions healing well, skin closed.        Gyn exam:  Vulva normal                     Bimanual:  Uterus AV, normal volume, NT, mobile.  No adnexal mass, NT.  Assessment/Plan:  46 y.o. [name]   1. Endometriosis of pelvis Moderate pelvic endometriosis post Left Oophorectomy with large Endometriomas.  Minimal endometriosis otherwise.  Will control with Progestin-only pill until Menopause.  Indication/risks/benefits reviewed.  2. Status post gynecological surgery, follow-up exam Good postop evolution.  No complication.  3. 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  Will continue investigation and treatment with Dr Charlestine Night.  Counseling >50% x 15 minutes on above issues.  [name] MD, 11:43 AM 07/11/2016

## 2016-07-11 NOTE — Patient Instructions (Signed)
1. Endometriosis of pelvis Moderate pelvic endometriosis post Left Oophorectomy with large Endometriomas.  Minimal endometriosis otherwise.  Will control with Progestin-only pill until Menopause.  Indication/risks/benefits reviewed.  2. Status post gynecological surgery, follow-up exam Good postop evolution.  No complication.  3. 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  Will continue investigation and treatment with Dr Charlestine Night.  Good to see you today!

## 2016-08-28 ENCOUNTER — Encounter: Payer: Self-pay | Admitting: Obstetrics & Gynecology

## 2017-04-06 ENCOUNTER — Encounter: Payer: 59 | Admitting: Obstetrics & Gynecology

## 2017-04-09 ENCOUNTER — Ambulatory Visit (INDEPENDENT_AMBULATORY_CARE_PROVIDER_SITE_OTHER): Payer: 59 | Admitting: Obstetrics & Gynecology

## 2017-04-09 ENCOUNTER — Encounter: Payer: Self-pay | Admitting: Obstetrics & Gynecology

## 2017-04-09 VITALS — BP 118/70 | Ht 63.0 in | Wt 162.0 lb

## 2017-04-09 DIAGNOSIS — Z01419 Encounter for gynecological examination (general) (routine) without abnormal findings: Secondary | ICD-10-CM

## 2017-04-09 DIAGNOSIS — Z9079 Acquired absence of other genital organ(s): Secondary | ICD-10-CM | POA: Diagnosis not present

## 2017-04-09 DIAGNOSIS — Z8742 Personal history of other diseases of the female genital tract: Secondary | ICD-10-CM | POA: Diagnosis not present

## 2017-04-09 MED ORDER — NORETHINDRONE 0.35 MG PO TABS: 1.0000 | ORAL_TABLET | Freq: Every day | ORAL | 4 refills | Status: DC

## 2017-04-09 NOTE — Progress Notes (Signed)
Emily Weaver 1970/10/05 [id number]   History:    47 y.o. G2P2L2 Married  RP:  Established patient presenting for annual gyn exam   HPI: H/O Endometriosis.  Had Robotic LSO, Rt Salpingectomy, Lysis of adhesions with me 06/20/2016.  Well on the Progestin only pill currently.  Had painful menses every month on the pill, but no menses at all on the last pack and mild hot flushes.  No pelvic pain currently.  No pain with IC.  Urine/BMs wnl.  Breasts wnl.  Enjoys Yoga and water aerobics.  BMI 28.70  Past medical history,surgical history, family history and social history were all reviewed and documented in the EPIC chart.  Gynecologic History Patient's last menstrual period was 03/19/2017. Contraception: Bilateral Salpingectomy Last Pap: 03/2016. Results were: ASCUS/HPV HR negative Last mammogram: 08/2016. Results were: Negative Bone Density: Never Colonoscopy: Never  Obstetric History OB History  Gravida Para Term Preterm AB Living  2 2       2   SAB TAB Ectopic Multiple Live Births               # Outcome Date GA Lbr Len/2nd Weight Sex Delivery Anes PTL Lv  2 Para           1 Para                ROS: A ROS was performed and pertinent positives and negatives are included in the history.  GENERAL: No fevers or chills. HEENT: No change in vision, no earache, sore throat or sinus congestion. NECK: No pain or stiffness. CARDIOVASCULAR: No chest pain or pressure. No palpitations. PULMONARY: No shortness of breath, cough or wheeze. GASTROINTESTINAL: No abdominal pain, nausea, vomiting or diarrhea, melena or bright red blood per rectum. GENITOURINARY: No urinary frequency, urgency, hesitancy or dysuria. MUSCULOSKELETAL: No joint or muscle pain, no back pain, no recent trauma. DERMATOLOGIC: No rash, no itching, no lesions. ENDOCRINE: No polyuria, polydipsia, no heat or cold intolerance. No recent change in weight. HEMATOLOGICAL: No anemia or easy bruising or bleeding. NEUROLOGIC: No  headache, seizures, numbness, tingling or weakness. PSYCHIATRIC: No depression, no loss of interest in normal activity or change in sleep pattern.     Exam:   BP 118/70   Ht 5\' 3"  (1.6 m)   Wt 162 lb (73.5 kg)   LMP 03/19/2017 Comment: pill  BMI 28.70 kg/m   Body mass index is 28.7 kg/m.  General appearance : Well developed well nourished female. No acute distress HEENT: Eyes: no retinal hemorrhage or exudates,  Neck supple, trachea midline, no carotid bruits, no thyroidmegaly Lungs: Clear to auscultation, no rhonchi or wheezes, or rib retractions  Heart: Regular rate and rhythm, no murmurs or gallops Breast:Examined in sitting and supine position were symmetrical in appearance, no palpable masses or tenderness,  no skin retraction, no nipple inversion, no nipple discharge, no skin discoloration, no axillary or supraclavicular lymphadenopathy Abdomen: no palpable masses or tenderness, no rebound or guarding Extremities: no edema or skin discoloration or tenderness  Pelvic: Vulva: Normal             Vagina: No gross lesions or discharge  Cervix: No gross lesions or discharge.  Pap reflex done.  Uterus  AV, normal size, shape and consistency, non-tender and mobile  Adnexa  Without masses or tenderness  Anus: Normal   Assessment/Plan:  47 y.o. female for annual gyn exam  1. Encounter for routine gynecological examination with Papanicolaou smear of cervix Normal gynecologic exam status  post LSO and right salpingectomy.  History of ASCUS with negative high risk HPV.  Pap reflex done today.  Breast exam normal.  Last screening mammogram negative in July 2018.  Health labs with family physician.  2. H/O bilateral salpingectomy  3. History of endometriosis Had a robotic left salpingo-oophorectomy and right salpingectomy and lysis of adhesions in May 2018 for a left endometriotic ovarian cyst and desire for sterilization.  No pelvic pain currently on the progestin only pill.  May be  getting into perimenopause given the absence of a menstrual period on the last pack.  Will observe on continued progestin only pill.  Other orders - norethindrone (MICRONOR,CAMILA,ERRIN) 0.35 MG tablet; Take 1 tablet (0.35 mg total) by mouth daily.   [name] MD, 11:31 AM 04/09/2017

## 2017-04-09 NOTE — Addendum Note (Signed)
Addended by: Thurnell Garbe A on: 04/09/2017 01:57 PM   Modules accepted: Orders

## 2017-04-09 NOTE — Patient Instructions (Signed)
1. Encounter for routine gynecological examination with Papanicolaou smear of cervix Normal gynecologic exam status post LSO and right salpingectomy.  History of ASCUS with negative high risk HPV.  Pap reflex done today.  Breast exam normal.  Last screening mammogram negative in July 2018.  Health labs with family physician.  2. H/O bilateral salpingectomy  3. History of endometriosis Had a robotic left salpingo-oophorectomy and right salpingectomy and lysis of adhesions in May 2018 for a left endometriotic ovarian cyst and desire for sterilization.  No pelvic pain currently on the progestin only pill.  May be getting into perimenopause given the absence of a menstrual period on the last pack.  Will observe on continued progestin only pill.  Other orders - norethindrone (MICRONOR,CAMILA,ERRIN) 0.35 MG tablet; Take 1 tablet (0.35 mg total) by mouth daily.  Emily Weaver, it was a pleasure seeing you today!  I will inform you of your results as soon as they are available.  Health Maintenance, Female Adopting a healthy lifestyle and getting preventive care can go a long way to promote health and wellness. Talk with your health care provider about what schedule of regular examinations is right for you. This is a good chance for you to check in with your provider about disease prevention and staying healthy. In between checkups, there are plenty of things you can do on your own. Experts have done a lot of research about which lifestyle changes and preventive measures are most likely to keep you healthy. Ask your health care provider for more information. Weight and diet Eat a healthy diet  Be sure to include plenty of vegetables, fruits, low-fat dairy products, and lean protein.  Do not eat a lot of foods high in solid fats, added sugars, or salt.  Get regular exercise. This is one of the most important things you can do for your health. ? Most adults should exercise for at least 150 minutes each week.  The exercise should increase your heart rate and make you sweat (moderate-intensity exercise). ? Most adults should also do strengthening exercises at least twice a week. This is in addition to the moderate-intensity exercise.  Maintain a healthy weight  Body mass index (BMI) is a measurement that can be used to identify possible weight problems. It estimates body fat based on height and weight. Your health care provider can help determine your BMI and help you achieve or maintain a healthy weight.  For females 48 years of age and older: ? A BMI below 18.5 is considered underweight. ? A BMI of 18.5 to 24.9 is normal. ? A BMI of 25 to 29.9 is considered overweight. ? A BMI of 30 and above is considered obese.  Watch levels of cholesterol and blood lipids  You should start having your blood tested for lipids and cholesterol at 49 years of age, then have this test every 5 years.  You may need to have your cholesterol levels checked more often if: ? Your lipid or cholesterol levels are high. ? You are older than 49 years of age. ? You are at high risk for heart disease.  Cancer screening Lung Cancer  Lung cancer screening is recommended for adults 14-35 years old who are at high risk for lung cancer because of a history of smoking.  A yearly low-dose CT scan of the lungs is recommended for people who: ? Currently smoke. ? Have quit within the past 15 years. ? Have at least a 30-pack-year history of smoking. A pack year is  smoking an average of one pack of cigarettes a day for 1 year.  Yearly screening should continue until it has been 15 years since you quit.  Yearly screening should stop if you develop a health problem that would prevent you from having lung cancer treatment.  Breast Cancer  Practice breast self-awareness. This means understanding how your breasts normally appear and feel.  It also means doing regular breast self-exams. Let your health care provider know about  any changes, no matter how small.  If you are in your 20s or 30s, you should have a clinical breast exam (CBE) by a health care provider every 1-3 years as part of a regular health exam.  If you are 61 or older, have a CBE every year. Also consider having a breast X-ray (mammogram) every year.  If you have a family history of breast cancer, talk to your health care provider about genetic screening.  If you are at high risk for breast cancer, talk to your health care provider about having an MRI and a mammogram every year.  Breast cancer gene (BRCA) assessment is recommended for women who have family members with BRCA-related cancers. BRCA-related cancers include: ? Breast. ? Ovarian. ? Tubal. ? Peritoneal cancers.  Results of the assessment will determine the need for genetic counseling and BRCA1 and BRCA2 testing.  Cervical Cancer Your health care provider may recommend that you be screened regularly for cancer of the pelvic organs (ovaries, uterus, and vagina). This screening involves a pelvic examination, including checking for microscopic changes to the surface of your cervix (Pap test). You may be encouraged to have this screening done every 3 years, beginning at age 43.  For women ages 6-65, health care providers may recommend pelvic exams and Pap testing every 3 years, or they may recommend the Pap and pelvic exam, combined with testing for human papilloma virus (HPV), every 5 years. Some types of HPV increase your risk of cervical cancer. Testing for HPV may also be done on women of any age with unclear Pap test results.  Other health care providers may not recommend any screening for nonpregnant women who are considered low risk for pelvic cancer and who do not have symptoms. Ask your health care provider if a screening pelvic exam is right for you.  If you have had past treatment for cervical cancer or a condition that could lead to cancer, you need Pap tests and screening for  cancer for at least 20 years after your treatment. If Pap tests have been discontinued, your risk factors (such as having a new sexual partner) need to be reassessed to determine if screening should resume. Some women have medical problems that increase the chance of getting cervical cancer. In these cases, your health care provider may recommend more frequent screening and Pap tests.  Colorectal Cancer  This type of cancer can be detected and often prevented.  Routine colorectal cancer screening usually begins at 49 years of age and continues through 49 years of age.  Your health care provider may recommend screening at an earlier age if you have risk factors for colon cancer.  Your health care provider may also recommend using home test kits to check for hidden blood in the stool.  A small camera at the end of a tube can be used to examine your colon directly (sigmoidoscopy or colonoscopy). This is done to check for the earliest forms of colorectal cancer.  Routine screening usually begins at age 34.  Direct examination  of the colon should be repeated every 5-10 years through 49 years of age. However, you may need to be screened more often if early forms of precancerous polyps or small growths are found.  Skin Cancer  Check your skin from head to toe regularly.  Tell your health care provider about any new moles or changes in moles, especially if there is a change in a mole's shape or color.  Also tell your health care provider if you have a mole that is larger than the size of a pencil eraser.  Always use sunscreen. Apply sunscreen liberally and repeatedly throughout the day.  Protect yourself by wearing long sleeves, pants, a wide-brimmed hat, and sunglasses whenever you are outside.  Heart disease, diabetes, and high blood pressure  High blood pressure causes heart disease and increases the risk of stroke. High blood pressure is more likely to develop in: ? People who have blood  pressure in the high end of the normal range (130-139/85-89 mm Hg). ? People who are overweight or obese. ? People who are African American.  If you are 85-26 years of age, have your blood pressure checked every 3-5 years. If you are 41 years of age or older, have your blood pressure checked every year. You should have your blood pressure measured twice-once when you are at a hospital or clinic, and once when you are not at a hospital or clinic. Record the average of the two measurements. To check your blood pressure when you are not at a hospital or clinic, you can use: ? An automated blood pressure machine at a pharmacy. ? A home blood pressure monitor.  If you are between 61 years and 32 years old, ask your health care provider if you should take aspirin to prevent strokes.  Have regular diabetes screenings. This involves taking a blood sample to check your fasting blood sugar level. ? If you are at a normal weight and have a low risk for diabetes, have this test once every three years after 49 years of age. ? If you are overweight and have a high risk for diabetes, consider being tested at a younger age or more often. Preventing infection Hepatitis B  If you have a higher risk for hepatitis B, you should be screened for this virus. You are considered at high risk for hepatitis B if: ? You were born in a country where hepatitis B is common. Ask your health care provider which countries are considered high risk. ? Your parents were born in a high-risk country, and you have not been immunized against hepatitis B (hepatitis B vaccine). ? You have HIV or AIDS. ? You use needles to inject street drugs. ? You live with someone who has hepatitis B. ? You have had sex with someone who has hepatitis B. ? You get hemodialysis treatment. ? You take certain medicines for conditions, including cancer, organ transplantation, and autoimmune conditions.  Hepatitis C  Blood testing is recommended  for: ? Everyone born from [birth year] through 1967. ? Anyone with known risk factors for hepatitis C.  Sexually transmitted infections (STIs)  You should be screened for sexually transmitted infections (STIs) including gonorrhea and chlamydia if: ? You are sexually active and are younger than 49 years of age. ? You are older than 49 years of age and your health care provider tells you that you are at risk for this type of infection. ? Your sexual activity has changed since you were last screened and you are  at an increased risk for chlamydia or gonorrhea. Ask your health care provider if you are at risk.  If you do not have HIV, but are at risk, it may be recommended that you take a prescription medicine daily to prevent HIV infection. This is called pre-exposure prophylaxis (PrEP). You are considered at risk if: ? You are sexually active and do not regularly use condoms or know the HIV status of your partner(s). ? You take drugs by injection. ? You are sexually active with a partner who has HIV.  Talk with your health care provider about whether you are at high risk of being infected with HIV. If you choose to begin PrEP, you should first be tested for HIV. You should then be tested every 3 months for as long as you are taking PrEP. Pregnancy  If you are premenopausal and you may become pregnant, ask your health care provider about preconception counseling.  If you may become pregnant, take 400 to 800 micrograms (mcg) of folic acid every day.  If you want to prevent pregnancy, talk to your health care provider about birth control (contraception). Osteoporosis and menopause  Osteoporosis is a disease in which the bones lose minerals and strength with aging. This can result in serious bone fractures. Your risk for osteoporosis can be identified using a bone density scan.  If you are 37 years of age or older, or if you are at risk for osteoporosis and fractures, ask your health care provider if  you should be screened.  Ask your health care provider whether you should take a calcium or vitamin D supplement to lower your risk for osteoporosis.  Menopause may have certain physical symptoms and risks.  Hormone replacement therapy may reduce some of these symptoms and risks. Talk to your health care provider about whether hormone replacement therapy is right for you. Follow these instructions at home:  Schedule regular health, dental, and eye exams.  Stay current with your immunizations.  Do not use any tobacco products including cigarettes, chewing tobacco, or electronic cigarettes.  If you are pregnant, do not drink alcohol.  If you are breastfeeding, limit how much and how often you drink alcohol.  Limit alcohol intake to no more than 1 drink per day for nonpregnant women. One drink equals 12 ounces of beer, 5 ounces of wine, or 1 ounces of hard liquor.  Do not use street drugs.  Do not share needles.  Ask your health care provider for help if you need support or information about quitting drugs.  Tell your health care provider if you often feel depressed.  Tell your health care provider if you have ever been abused or do not feel safe at home. This information is not intended to replace advice given to you by your health care provider. Make sure you discuss any questions you have with your health care provider. Document Released: 08/12/2010 Document Revised: 07/05/2015 Document Reviewed: 10/31/2014 Elsevier Interactive Patient Education  [employer].

## 2017-04-15 LAB — PAP IG W/ RFLX HPV ASCU

## 2017-04-15 LAB — HUMAN PAPILLOMAVIRUS, HIGH RISK: HPV DNA HIGH RISK: NOT DETECTED

## 2017-04-21 ENCOUNTER — Encounter: Payer: Self-pay | Admitting: Obstetrics & Gynecology

## 2017-06-08 ENCOUNTER — Other Ambulatory Visit: Payer: Self-pay

## 2017-06-08 MED ORDER — NORETHINDRONE 0.35 MG PO TABS: 1.0000 | ORAL_TABLET | Freq: Every day | ORAL | 3 refills | Status: DC

## 2017-09-02 ENCOUNTER — Other Ambulatory Visit: Payer: Self-pay | Admitting: *Deleted

## 2017-09-02 MED ORDER — NORETHINDRONE 0.35 MG PO TABS: 1.0000 | ORAL_TABLET | Freq: Every day | ORAL | 2 refills | Status: DC

## 2017-09-29 ENCOUNTER — Ambulatory Visit (INDEPENDENT_AMBULATORY_CARE_PROVIDER_SITE_OTHER): Payer: 59

## 2017-09-29 ENCOUNTER — Ambulatory Visit (INDEPENDENT_AMBULATORY_CARE_PROVIDER_SITE_OTHER): Payer: 59 | Admitting: Podiatry

## 2017-09-29 DIAGNOSIS — S92511A Displaced fracture of proximal phalanx of right lesser toe(s), initial encounter for closed fracture: Secondary | ICD-10-CM

## 2017-09-30 DIAGNOSIS — S92511A Displaced fracture of proximal phalanx of right lesser toe(s), initial encounter for closed fracture: Secondary | ICD-10-CM | POA: Insufficient documentation

## 2017-09-30 NOTE — Progress Notes (Signed)
Subjective:   Patient ID: Emily Weaver, female   DOB: 47 y.o.   MRN: [id number]   HPI 47 year old female presents the office today for concerns of left fourth toe pain.  She states that she had malignant the door about 3 weeks ago and she is having some constant, sharp pain to the toe with a pain level 5/10.  She states that it was bruised and swelling is gotten better with Aleve, icing but does continue.  She did go to the beach and she has had some discomfort with walking.  She has no other concerns.   Review of Systems  All other systems reviewed and are negative.  No past medical history on file.  Past Surgical History:  Procedure Laterality Date  . CYSTOSCOPY N/A 06/20/2016   Procedure: CYSTOSCOPY;  Surgeon: [name], MD;  Location: [facility];  Service: Gynecology;  Laterality: N/A;  . IUD REMOVAL N/A 06/20/2016   Procedure: INTRAUTERINE DEVICE (IUD) REMOVAL;  Surgeon: [name], MD;  Location: [facility];  Service: Gynecology;  Laterality: N/A;  BETH TO RNFA confirmed with chassity and kathy on 05/23/16   . lymph node removal     age 5 years old due to cat scratch fever  . REFRACTIVE SURGERY    . ROBOTIC ASSISTED BILATERAL SALPINGO OOPHERECTOMY Bilateral 06/20/2016   Procedure: ROBOTIC ASSISTED LEFT  SALPINGO-OOPHORECTOMY WITH RIGHT SALPINGECTOMY;  Surgeon: [name], MD;  Location: [facility];  Service: Gynecology;  Laterality: Bilateral;      With pelvic washings       Current Outpatient Medications:  .  Cyanocobalamin (B-12) 1000 MCG SUBL, Place 1,000 mcg under the tongue daily., Disp: , Rfl:  .  Melatonin 5 MG TABS, Take 5 mg by mouth at bedtime as needed (SLEEP)., Disp: , Rfl:  .  Multiple Vitamin (MULTIVITAMIN) tablet, Take 1 tablet by mouth daily., Disp: , Rfl:  .  norethindrone (MICRONOR,CAMILA,ERRIN) 0.35 MG tablet, Take 1 tablet (0.35 mg total) by mouth daily., Disp: 3 Package, Rfl: 2  No Known Allergies        Objective:  Physical  Exam  General: AAO x3, NAD  Dermatological: Skin is warm, dry and supple bilateral. Nails x 10 are well manicured; remaining integument appears unremarkable at this time. There are no open sores, no preulcerative lesions, no rash or signs of infection present.  Vascular: Dorsalis Pedis artery and Posterior Tibial artery pedal pulses are 2/4 bilateral with immedate capillary fill time.  There is no pain with calf compression, swelling, warmth, erythema.   Neruologic: Grossly intact via light touch bilateral. Protective threshold with Semmes Wienstein monofilament intact to all pedal sites bilateral.   Musculoskeletal: There is tenderness of the left fourth toe on the proximal phalanx.  There is mild swelling to the toe but there is no erythema warmth.  There is no significant discomfort of metatarsal or other digits.  Muscular strength 5/5 in all groups tested bilateral.  Gait: Unassisted, Nonantalgic.       Assessment:   Left fourth toe fracture     Plan:  -Treatment options discussed including all alternatives, risks, and complications -Etiology of symptoms were discussed -X-rays were obtained and reviewed with the patient.  Oblique fracture present the proximal bones with minimal displacement.  No other evidence of acute fracture. -Recommended buddy splinting of the toe daily.  Discussed the surgical shoe but I want her to wear at least a stiffer bottom shoe.  Hold off on exercise until the pain starts to  subside more.  -Recommend follow-up in the next few weeks if symptoms continue or before if there is any worsening.  Trula Slade DPM

## 2018-03-01 DIAGNOSIS — E538 Deficiency of other specified B group vitamins: Secondary | ICD-10-CM | POA: Diagnosis not present

## 2018-03-01 DIAGNOSIS — Z0000 Encounter for general adult medical examination without abnormal findings: Secondary | ICD-10-CM | POA: Diagnosis not present

## 2018-03-01 DIAGNOSIS — R82998 Other abnormal findings in urine: Secondary | ICD-10-CM | POA: Diagnosis not present

## 2018-03-08 DIAGNOSIS — Z1331 Encounter for screening for depression: Secondary | ICD-10-CM | POA: Diagnosis not present

## 2018-03-08 DIAGNOSIS — R945 Abnormal results of liver function studies: Secondary | ICD-10-CM | POA: Diagnosis not present

## 2018-03-08 DIAGNOSIS — E538 Deficiency of other specified B group vitamins: Secondary | ICD-10-CM | POA: Diagnosis not present

## 2018-03-08 DIAGNOSIS — M5416 Radiculopathy, lumbar region: Secondary | ICD-10-CM | POA: Diagnosis not present

## 2018-03-08 DIAGNOSIS — R0683 Snoring: Secondary | ICD-10-CM | POA: Diagnosis not present

## 2018-03-08 DIAGNOSIS — B351 Tinea unguium: Secondary | ICD-10-CM | POA: Diagnosis not present

## 2018-03-08 DIAGNOSIS — Z0000 Encounter for general adult medical examination without abnormal findings: Secondary | ICD-10-CM | POA: Diagnosis not present

## 2018-03-18 DIAGNOSIS — D1723 Benign lipomatous neoplasm of skin and subcutaneous tissue of right leg: Secondary | ICD-10-CM | POA: Diagnosis not present

## 2018-03-18 DIAGNOSIS — M25562 Pain in left knee: Secondary | ICD-10-CM | POA: Diagnosis not present

## 2018-03-18 DIAGNOSIS — Z1211 Encounter for screening for malignant neoplasm of colon: Secondary | ICD-10-CM | POA: Diagnosis not present

## 2018-03-18 DIAGNOSIS — Z1212 Encounter for screening for malignant neoplasm of rectum: Secondary | ICD-10-CM | POA: Diagnosis not present

## 2018-03-29 ENCOUNTER — Other Ambulatory Visit: Payer: Self-pay | Admitting: Orthopedic Surgery

## 2018-03-29 DIAGNOSIS — M25562 Pain in left knee: Secondary | ICD-10-CM

## 2018-03-31 ENCOUNTER — Ambulatory Visit
Admission: RE | Admit: 2018-03-31 | Discharge: 2018-03-31 | Disposition: A | Discharge status: Home / Self Care | Payer: BLUE CROSS/BLUE SHIELD | Source: Ambulatory Visit | Attending: Orthopedic Surgery | Admitting: Orthopedic Surgery

## 2018-03-31 DIAGNOSIS — M25562 Pain in left knee: Secondary | ICD-10-CM

## 2018-04-08 DIAGNOSIS — S83282A Other tear of lateral meniscus, current injury, left knee, initial encounter: Secondary | ICD-10-CM | POA: Diagnosis not present

## 2018-04-15 ENCOUNTER — Encounter: Payer: Self-pay | Admitting: Obstetrics & Gynecology

## 2018-04-15 ENCOUNTER — Ambulatory Visit (INDEPENDENT_AMBULATORY_CARE_PROVIDER_SITE_OTHER): Payer: BLUE CROSS/BLUE SHIELD | Admitting: Obstetrics & Gynecology

## 2018-04-15 VITALS — BP 110/72 | Ht 62.5 in | Wt 151.0 lb

## 2018-04-15 DIAGNOSIS — Z9079 Acquired absence of other genital organ(s): Secondary | ICD-10-CM

## 2018-04-15 DIAGNOSIS — Z01419 Encounter for gynecological examination (general) (routine) without abnormal findings: Secondary | ICD-10-CM

## 2018-04-15 DIAGNOSIS — Z8742 Personal history of other diseases of the female genital tract: Secondary | ICD-10-CM

## 2018-04-15 DIAGNOSIS — E663 Overweight: Secondary | ICD-10-CM

## 2018-04-15 DIAGNOSIS — R87618 Other abnormal cytological findings on specimens from cervix uteri: Secondary | ICD-10-CM | POA: Diagnosis not present

## 2018-04-15 MED ORDER — NORETHINDRONE 0.35 MG PO TABS: 1.0000 | ORAL_TABLET | Freq: Every day | ORAL | 4 refills | Status: DC

## 2018-04-15 NOTE — Patient Instructions (Signed)
1. Encounter for routine gynecological examination with Papanicolaou smear of cervix Normal gynecologic exam.  Pap reflex done.  Breast exam normal.  Screening mammogram in September 2019 was negative.  Cologuard negative in February 2020.  Health labs with family physician.  2. Status post bilateral salpingectomy  3. History of endometriosis Decision to start back on the progesterone only birth control pill.  Usage known.  Prescription sent to pharmacy.  4. Overweight (BMI 25.0-29.9) Recommend a lower calorie/carb diet such as [employer].  Intermittent fasting discussed.  Aerobic physical activities 5 times a week and weightlifting every 2 days.  Other orders - norethindrone (MICRONOR,CAMILA,ERRIN) 0.35 MG tablet; Take 1 tablet (0.35 mg total) by mouth daily.  Emily Weaver, it was a pleasure seeing you today!  I will inform you of your results as soon as they are available.

## 2018-04-15 NOTE — Progress Notes (Signed)
Altovise Wahler [birth year] [id number]   History:    48 y.o. G2P2L2 Married  RP:  Established patient presenting for annual gyn exam   HPI: Was well on the Progestin-only pill for h/o Endometriosis.  S/P Bilateral Salpingectomy and Left Oophorectomy.  Stopped in 09/2017, would like to restart.  Menses regular normal.  No BTB.  No pelvic pain.  Planning Left Knee surgery with Dr Maureen Ralphs for a partially torn ligament.  BMI 27.18.  Health labs wnl with Fam MD 02/2018.  Past medical history,surgical history, family history and social history were all reviewed and documented in the EPIC chart.  Gynecologic History Patient's last menstrual period was 03/26/2018. Contraception: Bilateral Salpingectomy Last Pap: 03/2017. Results were: ASCUS/HPV HR neg Last mammogram: 10/2017. Results were: Negative Bone Density: Never Colonoscopy: Never.  Cologard negative 03/2018  Obstetric History OB History  Gravida Para Term Preterm AB Living  2 2       2   SAB TAB Ectopic Multiple Live Births               # Outcome Date GA Lbr Len/2nd Weight Sex Delivery Anes PTL Lv  2 Para           1 Para              ROS: A ROS was performed and pertinent positives and negatives are included in the history.  GENERAL: No fevers or chills. HEENT: No change in vision, no earache, sore throat or sinus congestion. NECK: No pain or stiffness. CARDIOVASCULAR: No chest pain or pressure. No palpitations. PULMONARY: No shortness of breath, cough or wheeze. GASTROINTESTINAL: No abdominal pain, nausea, vomiting or diarrhea, melena or bright red blood per rectum. GENITOURINARY: No urinary frequency, urgency, hesitancy or dysuria. MUSCULOSKELETAL: No joint or muscle pain, no back pain, no recent trauma. DERMATOLOGIC: No rash, no itching, no lesions. ENDOCRINE: No polyuria, polydipsia, no heat or cold intolerance. No recent change in weight. HEMATOLOGICAL: No anemia or easy bruising or bleeding. NEUROLOGIC: No headache, seizures,  numbness, tingling or weakness. PSYCHIATRIC: No depression, no loss of interest in normal activity or change in sleep pattern.     Exam:   BP 110/72   Ht 5' 2.5" (1.588 m)   Wt 151 lb (68.5 kg)   LMP 03/26/2018   BMI 27.18 kg/m   Body mass index is 27.18 kg/m.  General appearance : Well developed well nourished female. No acute distress HEENT: Eyes: no retinal hemorrhage or exudates,  Neck supple, trachea midline, no carotid bruits, no thyroidmegaly Lungs: Clear to auscultation, no rhonchi or wheezes, or rib retractions  Heart: Regular rate and rhythm, no murmurs or gallops Breast:Examined in sitting and supine position were symmetrical in appearance, no palpable masses or tenderness,  no skin retraction, no nipple inversion, no nipple discharge, no skin discoloration, no axillary or supraclavicular lymphadenopathy Abdomen: no palpable masses or tenderness, no rebound or guarding Extremities: no edema or skin discoloration or tenderness  Pelvic: Vulva: Normal             Vagina: No gross lesions or discharge  Cervix: No gross lesions or discharge.  Pap reflex done.  Uterus  RV, normal size, shape and consistency, non-tender and mobile  Adnexa  Without masses or tenderness  Anus: Normal   Assessment/Plan:  48 y.o. female for annual exam   1. Encounter for routine gynecological examination with Papanicolaou smear of cervix Normal gynecologic exam.  Pap reflex done.  Breast exam normal.  Screening  mammogram in September 2019 was negative.  Cologuard negative in February 2020.  Health labs with family physician.  2. Status post bilateral salpingectomy  3. History of endometriosis Decision to start back on the progesterone only birth control pill.  Usage known.  Prescription sent to pharmacy.  4. Overweight (BMI 25.0-29.9) Recommend a lower calorie/carb diet such as [employer].  Intermittent fasting discussed.  Aerobic physical activities 5 times a week and weightlifting  every 2 days.  Other orders - norethindrone (MICRONOR,CAMILA,ERRIN) 0.35 MG tablet; Take 1 tablet (0.35 mg total) by mouth daily.  [name] MD, 12:02 PM 04/15/2018

## 2018-04-16 LAB — PAP IG W/ RFLX HPV ASCU

## 2018-07-13 DIAGNOSIS — L821 Other seborrheic keratosis: Secondary | ICD-10-CM | POA: Diagnosis not present

## 2018-07-13 DIAGNOSIS — B0089 Other herpesviral infection: Secondary | ICD-10-CM | POA: Diagnosis not present

## 2018-07-13 DIAGNOSIS — L812 Freckles: Secondary | ICD-10-CM | POA: Diagnosis not present

## 2018-07-13 DIAGNOSIS — L918 Other hypertrophic disorders of the skin: Secondary | ICD-10-CM | POA: Diagnosis not present

## 2018-11-09 ENCOUNTER — Encounter: Payer: Self-pay | Admitting: Obstetrics & Gynecology

## 2018-11-09 DIAGNOSIS — Z803 Family history of malignant neoplasm of breast: Secondary | ICD-10-CM | POA: Diagnosis not present

## 2018-11-09 DIAGNOSIS — Z1231 Encounter for screening mammogram for malignant neoplasm of breast: Secondary | ICD-10-CM | POA: Diagnosis not present

## 2018-11-10 DIAGNOSIS — M659 Synovitis and tenosynovitis, unspecified: Secondary | ICD-10-CM | POA: Diagnosis not present

## 2018-11-10 DIAGNOSIS — S83272A Complex tear of lateral meniscus, current injury, left knee, initial encounter: Secondary | ICD-10-CM | POA: Diagnosis not present

## 2018-11-10 DIAGNOSIS — X58XXXA Exposure to other specified factors, initial encounter: Secondary | ICD-10-CM | POA: Diagnosis not present

## 2018-11-10 DIAGNOSIS — M6752 Plica syndrome, left knee: Secondary | ICD-10-CM | POA: Diagnosis not present

## 2018-11-10 DIAGNOSIS — M94262 Chondromalacia, left knee: Secondary | ICD-10-CM | POA: Diagnosis not present

## 2018-11-10 DIAGNOSIS — Y999 Unspecified external cause status: Secondary | ICD-10-CM | POA: Diagnosis not present

## 2018-11-11 HISTORY — PX: KNEE CARTILAGE SURGERY: SHX688

## 2018-11-15 DIAGNOSIS — M6281 Muscle weakness (generalized): Secondary | ICD-10-CM | POA: Diagnosis not present

## 2018-11-15 DIAGNOSIS — M25562 Pain in left knee: Secondary | ICD-10-CM | POA: Diagnosis not present

## 2018-11-15 DIAGNOSIS — Z9889 Other specified postprocedural states: Secondary | ICD-10-CM | POA: Diagnosis not present

## 2018-11-23 DIAGNOSIS — M6281 Muscle weakness (generalized): Secondary | ICD-10-CM | POA: Diagnosis not present

## 2018-11-23 DIAGNOSIS — M25562 Pain in left knee: Secondary | ICD-10-CM | POA: Diagnosis not present

## 2018-11-23 DIAGNOSIS — Z9889 Other specified postprocedural states: Secondary | ICD-10-CM | POA: Diagnosis not present

## 2018-11-30 DIAGNOSIS — M6281 Muscle weakness (generalized): Secondary | ICD-10-CM | POA: Diagnosis not present

## 2018-11-30 DIAGNOSIS — Z9889 Other specified postprocedural states: Secondary | ICD-10-CM | POA: Diagnosis not present

## 2018-11-30 DIAGNOSIS — M25562 Pain in left knee: Secondary | ICD-10-CM | POA: Diagnosis not present

## 2019-04-18 ENCOUNTER — Other Ambulatory Visit: Payer: Self-pay

## 2019-04-19 ENCOUNTER — Encounter: Payer: Self-pay | Admitting: Obstetrics & Gynecology

## 2019-04-19 ENCOUNTER — Ambulatory Visit (INDEPENDENT_AMBULATORY_CARE_PROVIDER_SITE_OTHER): Payer: BC Managed Care – PPO | Admitting: Obstetrics & Gynecology

## 2019-04-19 VITALS — BP 110/70 | Ht 62.5 in | Wt 156.0 lb

## 2019-04-19 DIAGNOSIS — Z9079 Acquired absence of other genital organ(s): Secondary | ICD-10-CM

## 2019-04-19 DIAGNOSIS — Z8742 Personal history of other diseases of the female genital tract: Secondary | ICD-10-CM | POA: Diagnosis not present

## 2019-04-19 DIAGNOSIS — Z01419 Encounter for gynecological examination (general) (routine) without abnormal findings: Secondary | ICD-10-CM | POA: Diagnosis not present

## 2019-04-19 NOTE — Progress Notes (Signed)
Emily Weaver [birth year] [id number]   History:    49 y.o. G2P2L2 Married  RP:  Established patient presenting for annual gyn exam   HPI:  H/O Endometriosis, S/P Bilateral Salpingectomy and Left Oophorectomy.  Menses regular normal every month.  No BTB.  No pelvic pain.  Breasts normal.  BMI 28.08.  Exercising regularly with [employer] programs.  Health labs wnl with Fam MD.  Cologard Negative.  Past medical history,surgical history, family history and social history were all reviewed and documented in the EPIC chart.  Gynecologic History Patient's last menstrual period was 03/26/2019.  Obstetric History OB History  Gravida Para Term Preterm AB Living  2 2       2   SAB TAB Ectopic Multiple Live Births               # Outcome Date GA Lbr Len/2nd Weight Sex Delivery Anes PTL Lv  2 Para           1 Para              ROS: A ROS was performed and pertinent positives and negatives are included in the history.  GENERAL: No fevers or chills. HEENT: No change in vision, no earache, sore throat or sinus congestion. NECK: No pain or stiffness. CARDIOVASCULAR: No chest pain or pressure. No palpitations. PULMONARY: No shortness of breath, cough or wheeze. GASTROINTESTINAL: No abdominal pain, nausea, vomiting or diarrhea, melena or bright red blood per rectum. GENITOURINARY: No urinary frequency, urgency, hesitancy or dysuria. MUSCULOSKELETAL: No joint or muscle pain, no back pain, no recent trauma. DERMATOLOGIC: No rash, no itching, no lesions. ENDOCRINE: No polyuria, polydipsia, no heat or cold intolerance. No recent change in weight. HEMATOLOGICAL: No anemia or easy bruising or bleeding. NEUROLOGIC: No headache, seizures, numbness, tingling or weakness. PSYCHIATRIC: No depression, no loss of interest in normal activity or change in sleep pattern.     Exam:   BP 110/70   Ht 5' 2.5" (1.588 m)   Wt 156 lb (70.8 kg)   LMP 03/26/2019   BMI 28.08 kg/m   Body mass index is 28.08  kg/m.  General appearance : Well developed well nourished female. No acute distress HEENT: Eyes: no retinal hemorrhage or exudates,  Neck supple, trachea midline, no carotid bruits, no thyroidmegaly Lungs: Clear to auscultation, no rhonchi or wheezes, or rib retractions  Heart: Regular rate and rhythm, no murmurs or gallops Breast:Examined in sitting and supine position were symmetrical in appearance, no palpable masses or tenderness,  no skin retraction, no nipple inversion, no nipple discharge, no skin discoloration, no axillary or supraclavicular lymphadenopathy Abdomen: no palpable masses or tenderness, no rebound or guarding Extremities: no edema or skin discoloration or tenderness  Pelvic: Vulva: Normal             Vagina: No gross lesions or discharge  Cervix: No gross lesions or discharge  Uterus  RV, normal size, shape and consistency, non-tender and mobile  Adnexa  Without masses or tenderness  Anus: Normal   Assessment/Plan:  49 y.o. female for annual exam   1. Well female exam with routine gynecological exam Normal gynecologic exam.  Pap test March 2020 was negative, no indication to repeat this year.  Breast exam normal.  Screening mammogram September 2020 was negative.  Cologuard negative in 2020.  Health labs with family physician.  Body mass index 28.08.  Continue with fitness and healthy nutrition.  2. Status post bilateral salpingectomy  3. History of endometriosis  Currently asymptomatic.  We will continue to observe.  [name] MD, 11:37 AM 04/19/2019

## 2019-04-23 ENCOUNTER — Encounter: Payer: Self-pay | Admitting: Obstetrics & Gynecology

## 2019-04-23 NOTE — Patient Instructions (Signed)
1. Well female exam with routine gynecological exam Normal gynecologic exam.  Pap test March 2020 was negative, no indication to repeat this year.  Breast exam normal.  Screening mammogram September 2020 was negative.  Cologuard negative in 2020.  Health labs with family physician.  Body mass index 28.08.  Continue with fitness and healthy nutrition.  2. Status post bilateral salpingectomy  3. History of endometriosis Currently asymptomatic.  We will continue to observe.  Emily Weaver, it was a pleasure seeing you today!

## 2019-04-25 DIAGNOSIS — R7989 Other specified abnormal findings of blood chemistry: Secondary | ICD-10-CM | POA: Diagnosis not present

## 2019-04-25 DIAGNOSIS — E538 Deficiency of other specified B group vitamins: Secondary | ICD-10-CM | POA: Diagnosis not present

## 2019-04-25 DIAGNOSIS — Z0000 Encounter for general adult medical examination without abnormal findings: Secondary | ICD-10-CM | POA: Diagnosis not present

## 2019-04-26 DIAGNOSIS — E538 Deficiency of other specified B group vitamins: Secondary | ICD-10-CM | POA: Diagnosis not present

## 2019-05-02 DIAGNOSIS — R3121 Asymptomatic microscopic hematuria: Secondary | ICD-10-CM | POA: Diagnosis not present

## 2019-05-02 DIAGNOSIS — M25562 Pain in left knee: Secondary | ICD-10-CM | POA: Diagnosis not present

## 2019-05-02 DIAGNOSIS — Z1331 Encounter for screening for depression: Secondary | ICD-10-CM | POA: Diagnosis not present

## 2019-05-02 DIAGNOSIS — R945 Abnormal results of liver function studies: Secondary | ICD-10-CM | POA: Diagnosis not present

## 2019-05-02 DIAGNOSIS — B351 Tinea unguium: Secondary | ICD-10-CM | POA: Diagnosis not present

## 2019-05-02 DIAGNOSIS — Z0000 Encounter for general adult medical examination without abnormal findings: Secondary | ICD-10-CM | POA: Diagnosis not present

## 2019-06-09 DIAGNOSIS — M25551 Pain in right hip: Secondary | ICD-10-CM | POA: Diagnosis not present

## 2019-06-09 DIAGNOSIS — M7061 Trochanteric bursitis, right hip: Secondary | ICD-10-CM | POA: Diagnosis not present

## 2019-06-09 DIAGNOSIS — M25552 Pain in left hip: Secondary | ICD-10-CM | POA: Diagnosis not present

## 2019-06-09 DIAGNOSIS — M545 Low back pain: Secondary | ICD-10-CM | POA: Diagnosis not present

## 2019-06-09 DIAGNOSIS — M7062 Trochanteric bursitis, left hip: Secondary | ICD-10-CM | POA: Diagnosis not present

## 2019-07-13 DIAGNOSIS — D2272 Melanocytic nevi of left lower limb, including hip: Secondary | ICD-10-CM | POA: Diagnosis not present

## 2019-07-13 DIAGNOSIS — L812 Freckles: Secondary | ICD-10-CM | POA: Diagnosis not present

## 2019-07-13 DIAGNOSIS — L821 Other seborrheic keratosis: Secondary | ICD-10-CM | POA: Diagnosis not present

## 2019-07-13 DIAGNOSIS — D225 Melanocytic nevi of trunk: Secondary | ICD-10-CM | POA: Diagnosis not present

## 2019-07-14 DIAGNOSIS — D649 Anemia, unspecified: Secondary | ICD-10-CM | POA: Diagnosis not present

## 2019-07-14 DIAGNOSIS — R1030 Lower abdominal pain, unspecified: Secondary | ICD-10-CM | POA: Diagnosis not present

## 2019-07-15 ENCOUNTER — Other Ambulatory Visit: Payer: Self-pay | Admitting: Internal Medicine

## 2019-07-15 DIAGNOSIS — R1030 Lower abdominal pain, unspecified: Secondary | ICD-10-CM

## 2019-07-20 ENCOUNTER — Ambulatory Visit
Admission: RE | Admit: 2019-07-20 | Discharge: 2019-07-20 | Disposition: A | Discharge status: Home / Self Care | Payer: BC Managed Care – PPO | Source: Ambulatory Visit | Attending: Internal Medicine | Admitting: Internal Medicine

## 2019-07-20 DIAGNOSIS — R1030 Lower abdominal pain, unspecified: Secondary | ICD-10-CM

## 2019-07-20 DIAGNOSIS — R109 Unspecified abdominal pain: Secondary | ICD-10-CM | POA: Diagnosis not present

## 2019-07-20 MED ORDER — IOPAMIDOL (ISOVUE-300) INJECTION 61%
100.0000 mL | Freq: Once | INTRAVENOUS | Status: AC | PRN
  Administered 2019-07-20: 100 mL via INTRAVENOUS

## 2019-07-25 ENCOUNTER — Other Ambulatory Visit: Payer: Self-pay

## 2019-07-25 ENCOUNTER — Telehealth: Payer: Self-pay

## 2019-07-25 DIAGNOSIS — N852 Hypertrophy of uterus: Secondary | ICD-10-CM

## 2019-07-25 NOTE — Telephone Encounter (Signed)
Patient had CE scan with another MD but result is in her chart.  Some concerns about uterus and cervix. She asked if you would take a look at it and call her with your thoughts on it.

## 2019-07-25 NOTE — Telephone Encounter (Signed)
Patient advised. Claudia to schedule appt. Order placed.

## 2019-07-25 NOTE — Telephone Encounter (Signed)
CT Scan of Pelvis:  Likely Fibroids, possibly Adenomyosis.  Not too concerning, but recommend a Pelvic US here to further investigate.  Please schedule patient for a Pelvic US with me.

## 2019-08-18 ENCOUNTER — Ambulatory Visit (INDEPENDENT_AMBULATORY_CARE_PROVIDER_SITE_OTHER): Payer: BC Managed Care – PPO | Admitting: Obstetrics & Gynecology

## 2019-08-18 ENCOUNTER — Other Ambulatory Visit: Payer: Self-pay

## 2019-08-18 ENCOUNTER — Encounter: Payer: Self-pay | Admitting: Obstetrics & Gynecology

## 2019-08-18 ENCOUNTER — Ambulatory Visit (INDEPENDENT_AMBULATORY_CARE_PROVIDER_SITE_OTHER): Payer: BC Managed Care – PPO

## 2019-08-18 VITALS — BP 128/80

## 2019-08-18 DIAGNOSIS — D219 Benign neoplasm of connective and other soft tissue, unspecified: Secondary | ICD-10-CM | POA: Diagnosis not present

## 2019-08-18 DIAGNOSIS — D251 Intramural leiomyoma of uterus: Secondary | ICD-10-CM | POA: Diagnosis not present

## 2019-08-18 DIAGNOSIS — D252 Subserosal leiomyoma of uterus: Secondary | ICD-10-CM

## 2019-08-18 DIAGNOSIS — N951 Menopausal and female climacteric states: Secondary | ICD-10-CM

## 2019-08-18 DIAGNOSIS — N852 Hypertrophy of uterus: Secondary | ICD-10-CM | POA: Diagnosis not present

## 2019-08-18 DIAGNOSIS — R1084 Generalized abdominal pain: Secondary | ICD-10-CM

## 2019-08-18 DIAGNOSIS — N854 Malposition of uterus: Secondary | ICD-10-CM

## 2019-08-18 NOTE — Progress Notes (Signed)
    Emily Weaver Jun 08, 1970 [id number]        49 y.o.  G2P2L2 Married  RP: Abdominopelvic pain for Pelvic US  HPI: S/P Bilateral Salpingectomy/Lt Oophorectomy.  Perimenopausal with Oligomenorrhea.  Skipped menses x 3 months, then was bloated with abdominopelvic discomfort and had a heavier period than usual 2 weeks later.  Investigating abdominal pain, CT abdo-pelvis 07/20/2019 Reproductive: The uterus is bulky and heterogeneous. The cervix is likewise enlarged and heterogeneous, measuring at least 6.3 cm. Cysts or follicles of the right ovary.   OB History  Gravida Para Term Preterm AB Living  2 2       2   SAB TAB Ectopic Multiple Live Births               # Outcome Date GA Lbr Len/2nd Weight Sex Delivery Anes PTL Lv  2 Para           1 Para             Past medical history,surgical history, problem list, medications, allergies, family history and social history were all reviewed and documented in the EPIC chart.   Directed ROS with pertinent positives and negatives documented in the history of present illness/assessment and plan.  Exam:  Vitals:   08/18/19 1000  BP: 128/80   General appearance:  Normal  Pelvic US today: T/V images.  Retroverted uterus, bulky with multiple fibroids, the uterus is measured at 8.13 x 5.51 x 4.48 cm.  Intramural, subserosal and pedunculated fibroids the largest of which is 1.6 cm and pedunculated located at the right posterior and lateral aspect of the uterus.  The endometrial lining is thin and symmetrical measured at 3.39 mm with no mass or thickening seen.  Right ovary is seen with follicles.  Left ovary is absent.  Left adnexa is normal with bowels present and no adnexal mass.  No free fluid in the posterior cul-de-sac.   Assessment/Plan:  49 y.o. [name]   1. Generalized abdominal pain Patient is post bilateral salpingectomy and left oophorectomy.  Pelvic ultrasound thoroughly reviewed with patient.  The uterus shows small fibroids but  the overall size is within normal limits.  No ovarian cyst or mass, only follicles on the right ovary.  No free fluid in the posterior cul-de-sac.  Patient's symptoms are likely intestinal in origin.  Patient is perimenopausal with oligomenorrhea and stronger PMS when having anovulatory cycle.  Prefers no progestin treatment to control the cycle at this time.  Will observe.  2. Fibroids Multiple small uterine fibroids.  3. Perimenopause Oligomenorrhea with stronger PMS when having an ovulatory cycle.  Prefers observation over progestin treatment at this time.  [name] MD, 10:56 AM 08/18/2019

## 2019-08-23 ENCOUNTER — Encounter: Payer: Self-pay | Admitting: Obstetrics & Gynecology

## 2019-11-10 ENCOUNTER — Encounter: Payer: Self-pay | Admitting: Obstetrics & Gynecology

## 2019-11-10 DIAGNOSIS — D649 Anemia, unspecified: Secondary | ICD-10-CM | POA: Diagnosis not present

## 2019-11-10 DIAGNOSIS — Z1231 Encounter for screening mammogram for malignant neoplasm of breast: Secondary | ICD-10-CM | POA: Diagnosis not present

## 2019-11-10 DIAGNOSIS — Z23 Encounter for immunization: Secondary | ICD-10-CM | POA: Diagnosis not present

## 2020-01-20 ENCOUNTER — Ambulatory Visit (INDEPENDENT_AMBULATORY_CARE_PROVIDER_SITE_OTHER): Payer: BC Managed Care – PPO | Admitting: Podiatry

## 2020-01-20 ENCOUNTER — Other Ambulatory Visit: Payer: Self-pay

## 2020-01-20 ENCOUNTER — Ambulatory Visit (INDEPENDENT_AMBULATORY_CARE_PROVIDER_SITE_OTHER): Payer: BC Managed Care – PPO

## 2020-01-20 DIAGNOSIS — M722 Plantar fascial fibromatosis: Secondary | ICD-10-CM

## 2020-01-20 DIAGNOSIS — S93401A Sprain of unspecified ligament of right ankle, initial encounter: Secondary | ICD-10-CM

## 2020-01-20 DIAGNOSIS — M79671 Pain in right foot: Secondary | ICD-10-CM

## 2020-01-24 ENCOUNTER — Encounter: Payer: Self-pay | Admitting: Podiatry

## 2020-01-24 NOTE — Progress Notes (Signed)
Subjective:  Patient ID: Emily Weaver, female    DOB: 03/20/1970,  MRN: [id number]  Chief Complaint  Patient presents with  . Foot Pain    Right foot pain. PT stated that the pain is worse in the morning but does not get better throughout the day.     49 y.o. female presents with the above complaint.  Patient presents with complaint of right foot and ankle pain that has been going on since Wednesday before Thanksgiving.  Patient states he is due to go 4 mile walk on the beach on an uneven surface that led to this pain afterwards.  Patient states it hurts on the side of the foot as well as mid of the foot worse in the morning and has not gotten any better throughout the day.  Is very painful to walk on it.  She has not tried any other treatment option besides resting it.  She wants to get eval make sure that there is nothing going on.  She has a wedding this coming up in the near future.  She has not seen anyone else prior to seeing me for this.   Review of Systems: Negative except as noted in the HPI. Denies N/V/F/Ch.  No past medical history on file.  Current Outpatient Medications:  .  Cyanocobalamin (B-12) 1000 MCG SUBL, Place 1,000 mcg under the tongue daily., Disp: , Rfl:  .  Melatonin 5 MG TABS, Take 5 mg by mouth at bedtime as needed (SLEEP)., Disp: , Rfl:  .  Multiple Vitamin (MULTIVITAMIN) tablet, Take 1 tablet by mouth daily., Disp: , Rfl:   Social History   Tobacco Use  Smoking Status Never Smoker  Smokeless Tobacco Never Used    No Known Allergies Objective:  There were no vitals filed for this visit. There is no height or weight on file to calculate BMI. Constitutional Well developed. Well nourished.  Vascular Dorsalis pedis pulses palpable bilaterally. Posterior tibial pulses palpable bilaterally. Capillary refill normal to all digits.  No cyanosis or clubbing noted. Pedal hair growth normal.  Neurologic Normal speech. Oriented to person, place, and  time. Epicritic sensation to light touch grossly present bilaterally.  Dermatologic Nails well groomed and normal in appearance. No open wounds. No skin lesions.  Orthopedic:  Pain on palpation to the right medial calcaneal tuber as well as right lateral ankle.  Pain with plantarflexion inversion of the foot.  No pain with dorsiflexion eversion of the foot.  No intra-articular ankle joint pain.  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   Radiographs: 3 views of skeletally mature adult right foot: No osseous fracture noted.  No abnormalities noted.  Good osseous alignment noted. Assessment:   1. Plantar fasciitis of right foot   2. Moderate ankle sprain, right, initial encounter    Plan:  Patient was evaluated and treated and all questions answered.  Right plantar fasciitis and ATFL ligament sprain -I explained to the patient the etiology of plantar fasciitis as well as ATF ligament sprain and various treatment options were discussed.  Given that the beach likely aggravated the foot with uneven surface without having time to properly heal I believe patient will benefit from a cam boot immobilization to allow the foot and ankle to completely mobilize and allow the soft tissue to heal appropriately.  At this time radiographically she does not have any osseous break or involvement. -Given that she also has a wedding coming up I believe she will benefit from Tri-Lock ankle brace  if her pain resolves in cam boot immobilization I have asked her to transition herself to Tri-Lock ankle brace prior to the wedding.  Patient states understanding and will do so.  No follow-ups on file.

## 2020-02-22 ENCOUNTER — Encounter: Payer: Self-pay | Admitting: Podiatry

## 2020-02-22 ENCOUNTER — Ambulatory Visit (INDEPENDENT_AMBULATORY_CARE_PROVIDER_SITE_OTHER): Payer: BC Managed Care – PPO | Admitting: Podiatry

## 2020-02-22 ENCOUNTER — Other Ambulatory Visit: Payer: Self-pay

## 2020-02-22 DIAGNOSIS — M7731 Calcaneal spur, right foot: Secondary | ICD-10-CM

## 2020-02-22 DIAGNOSIS — M722 Plantar fascial fibromatosis: Secondary | ICD-10-CM

## 2020-02-22 NOTE — Progress Notes (Signed)
  Subjective:  Patient ID: Emily Weaver, female    DOB: 1970/10/17,  MRN: [id number]  Chief Complaint  Patient presents with  . Plantar Fasciitis    Right foot plantar fasciitis. PT stated that the boot did not help and she is still having pain but mostly with walking.    50 y.o. female presents with the above complaint.  Patient presents with follow-up from plantar fasciitis which is doing a lot better however the plantar fasciitis pain has transitioned more to the lateral plantar midfoot.  Patient states that is very painful to touch over there.  She does not have any ankle pain as well as heel pain anymore.  She would like to address this new pain that has transition down the plantar fascia.  She has not received a steroid injection.  She states the boot did not help.  She did not utilize a Tri-Lock brace that was given.  She denies any other acute complaints.   Review of Systems: Negative except as noted in the HPI. Denies N/V/F/Ch.  History reviewed. No pertinent past medical history.  Current Outpatient Medications:  .  Cyanocobalamin (B-12) 1000 MCG SUBL, Place 1,000 mcg under the tongue daily., Disp: , Rfl:  .  Melatonin 5 MG TABS, Take 5 mg by mouth at bedtime as needed (SLEEP)., Disp: , Rfl:  .  Multiple Vitamin (MULTIVITAMIN) tablet, Take 1 tablet by mouth daily., Disp: , Rfl:  .  valACYclovir (VALTREX) 500 MG tablet, Take 500 mg by mouth daily., Disp: , Rfl:   Social History   Tobacco Use  Smoking Status Never Smoker  Smokeless Tobacco Never Used    No Known Allergies Objective:  There were no vitals filed for this visit. There is no height or weight on file to calculate BMI. Constitutional Well developed. Well nourished.  Vascular Dorsalis pedis pulses palpable bilaterally. Posterior tibial pulses palpable bilaterally. Capillary refill normal to all digits.  No cyanosis or clubbing noted. Pedal hair growth normal.  Neurologic Normal speech. Oriented to  person, place, and time. Epicritic sensation to light touch grossly present bilaterally.  Dermatologic Nails well groomed and normal in appearance. No open wounds. No skin lesions.  Orthopedic: Normal joint ROM without pain or crepitus bilaterally. No visible deformities. No tender to palpation at the calcaneal tuber right.  Pain at the plantar lateral midfoot band of the plantar fascia  no pain with calcaneal squeeze right. Ankle ROM diminished range of motion right. Silfverskiold Test: positive bilaterally.   Radiographs: Taken and reviewed. No acute fractures or dislocations. No evidence of stress fracture.  Plantar heel spur absent. Posterior heel spur present.   Assessment:   1. Plantar fasciitis of right foot   2. Heel spur, right    Plan:  Patient was evaluated and treated and all questions answered.  Plantar Fasciitis, right lateral midfoot plantar band with underlying heel spur - XR reviewed as above.  - Educated on icing and stretching. Instructions given.  - Injection delivered to the plantar fascia as below. - DME: Plantar Fascial Brace - Pharmacologic management: None  Procedure: Injection Tendon/Ligament Location: Right plantar fascia at the glabrous junction; medial approach. Skin Prep: alcohol Injectate: 0.5 cc 0.5% marcaine plain, 0.5 cc of 1% Lidocaine, 0.5 cc kenalog 10. Disposition: Patient tolerated procedure well. Injection site dressed with a band-aid.  No follow-ups on file.

## 2020-03-23 ENCOUNTER — Ambulatory Visit: Payer: BC Managed Care – PPO | Admitting: Podiatry

## 2020-04-24 ENCOUNTER — Ambulatory Visit (INDEPENDENT_AMBULATORY_CARE_PROVIDER_SITE_OTHER): Payer: BC Managed Care – PPO | Admitting: Obstetrics & Gynecology

## 2020-04-24 ENCOUNTER — Encounter: Payer: Self-pay | Admitting: Obstetrics & Gynecology

## 2020-04-24 ENCOUNTER — Other Ambulatory Visit: Payer: Self-pay

## 2020-04-24 VITALS — BP 126/78 | Ht 63.0 in | Wt 157.6 lb

## 2020-04-24 DIAGNOSIS — E663 Overweight: Secondary | ICD-10-CM

## 2020-04-24 DIAGNOSIS — Z01419 Encounter for gynecological examination (general) (routine) without abnormal findings: Secondary | ICD-10-CM

## 2020-04-24 DIAGNOSIS — Z9079 Acquired absence of other genital organ(s): Secondary | ICD-10-CM | POA: Diagnosis not present

## 2020-04-24 DIAGNOSIS — N951 Menopausal and female climacteric states: Secondary | ICD-10-CM

## 2020-04-24 NOTE — Progress Notes (Signed)
Emily Weaver 1970/11/30 [id number]   History:    50 y.o.  G2P2L2 Married  [id number] presenting for annual gyn exam   HPI:  H/O Endometriosis, S/P Bilateral Salpingectomy and Left Oophorectomy.  Oligomenorrhea with LMP 11/2019. No BTB. Hot flushes and difficulty sleeping. No pelvic pain.  No pain with IC.  Breasts normal. BMI 27.92.  Exercising regularly with [employer] programs. Difficulty loosing weight.  Health labs wnl with Fam MD.  Cologard Negative in 2020.  Past medical history,surgical history, family history and social history were all reviewed and documented in the EPIC chart.  Gynecologic History Patient's last menstrual period was 11/25/2019.  Obstetric History OB History  Gravida Para Term Preterm AB Living  2 2       2   SAB IAB Ectopic Multiple Live Births               # Outcome Date GA Lbr Len/2nd Weight Sex Delivery Anes PTL Lv  2 Para           1 Para              ROS: A ROS was performed and pertinent positives and negatives are included in the history.  GENERAL: No fevers or chills. HEENT: No change in vision, no earache, sore throat or sinus congestion. NECK: No pain or stiffness. CARDIOVASCULAR: No chest pain or pressure. No palpitations. PULMONARY: No shortness of breath, cough or wheeze. GASTROINTESTINAL: No abdominal pain, nausea, vomiting or diarrhea, melena or bright red blood per rectum. GENITOURINARY: No urinary frequency, urgency, hesitancy or dysuria. MUSCULOSKELETAL: No joint or muscle pain, no back pain, no recent trauma. DERMATOLOGIC: No rash, no itching, no lesions. ENDOCRINE: No polyuria, polydipsia, no heat or cold intolerance. No recent change in weight. HEMATOLOGICAL: No anemia or easy bruising or bleeding. NEUROLOGIC: No headache, seizures, numbness, tingling or weakness. PSYCHIATRIC: No depression, no loss of interest in normal activity or change in sleep pattern.     Exam:   BP 126/78   Ht 5\' 3"  (1.6 m)   Wt 157 lb  9.6 oz (71.5 kg)   LMP 11/25/2019   BMI 27.92 kg/m   Body mass index is 27.92 kg/m.  General appearance : Well developed well nourished female. No acute distress HEENT: Eyes: no retinal hemorrhage or exudates,  Neck supple, trachea midline, no carotid bruits, no thyroidmegaly Lungs: Clear to auscultation, no rhonchi or wheezes, or rib retractions  Heart: Regular rate and rhythm, no murmurs or gallops Breast:Examined in sitting and supine position were symmetrical in appearance, no palpable masses or tenderness,  no skin retraction, no nipple inversion, no nipple discharge, no skin discoloration, no axillary or supraclavicular lymphadenopathy Abdomen: no palpable masses or tenderness, no rebound or guarding Extremities: no edema or skin discoloration or tenderness  Pelvic: Vulva: Normal             Vagina: No gross lesions or discharge  Cervix: No gross lesions or discharge.  Pap reflex done.  Uterus  AV, normal size, shape and consistency, non-tender and mobile  Adnexa  Without masses or tenderness  Anus: Normal   Assessment/Plan:  50 y.o. female for annual exam   1. Encounter for routine gynecological examination with Papanicolaou smear of cervix Normal gynecologic exam.  Pap reflex done.  Breast exam normal.  Screening mammogram negative September 2021.  Cologuard 2020.  Health labs with family physician.  2. Status post bilateral salpingectomy  3. Perimenopause Perimenopausal.  Will observe at this time.  4. Overweight (BMI 25.0-29.9) Difficulty losing weight.  Recommend a low-carb/calorie diet with intermittent fasting.  Aerobic activities 5 times a week and light weightlifting every 2 days.  [name] MD, 11:46 AM 04/24/2020

## 2020-04-26 LAB — PAP IG W/ RFLX HPV ASCU

## 2020-04-30 ENCOUNTER — Encounter: Payer: Self-pay | Admitting: Obstetrics & Gynecology

## 2020-05-01 DIAGNOSIS — Z0000 Encounter for general adult medical examination without abnormal findings: Secondary | ICD-10-CM | POA: Diagnosis not present

## 2020-05-01 DIAGNOSIS — E538 Deficiency of other specified B group vitamins: Secondary | ICD-10-CM | POA: Diagnosis not present

## 2020-05-01 DIAGNOSIS — R7989 Other specified abnormal findings of blood chemistry: Secondary | ICD-10-CM | POA: Diagnosis not present

## 2020-05-01 DIAGNOSIS — Z780 Asymptomatic menopausal state: Secondary | ICD-10-CM | POA: Diagnosis not present

## 2020-05-08 DIAGNOSIS — Z1331 Encounter for screening for depression: Secondary | ICD-10-CM | POA: Diagnosis not present

## 2020-05-08 DIAGNOSIS — Z0000 Encounter for general adult medical examination without abnormal findings: Secondary | ICD-10-CM | POA: Diagnosis not present

## 2020-05-08 DIAGNOSIS — E538 Deficiency of other specified B group vitamins: Secondary | ICD-10-CM | POA: Diagnosis not present

## 2020-05-08 DIAGNOSIS — R82998 Other abnormal findings in urine: Secondary | ICD-10-CM | POA: Diagnosis not present

## 2020-07-11 DIAGNOSIS — D225 Melanocytic nevi of trunk: Secondary | ICD-10-CM | POA: Diagnosis not present

## 2020-07-11 DIAGNOSIS — D2261 Melanocytic nevi of right upper limb, including shoulder: Secondary | ICD-10-CM | POA: Diagnosis not present

## 2020-07-11 DIAGNOSIS — L821 Other seborrheic keratosis: Secondary | ICD-10-CM | POA: Diagnosis not present

## 2020-07-11 DIAGNOSIS — D2262 Melanocytic nevi of left upper limb, including shoulder: Secondary | ICD-10-CM | POA: Diagnosis not present

## 2020-10-09 DIAGNOSIS — N3281 Overactive bladder: Secondary | ICD-10-CM | POA: Diagnosis not present

## 2020-10-09 DIAGNOSIS — N393 Stress incontinence (female) (male): Secondary | ICD-10-CM | POA: Diagnosis not present

## 2020-10-23 ENCOUNTER — Other Ambulatory Visit: Payer: Self-pay

## 2020-10-23 ENCOUNTER — Encounter: Payer: Self-pay | Admitting: Obstetrics & Gynecology

## 2020-10-23 ENCOUNTER — Ambulatory Visit (INDEPENDENT_AMBULATORY_CARE_PROVIDER_SITE_OTHER): Payer: BC Managed Care – PPO | Admitting: Obstetrics & Gynecology

## 2020-10-23 VITALS — BP 120/80 | HR 76 | Temp 98.2°F | Resp 16

## 2020-10-23 DIAGNOSIS — M545 Low back pain, unspecified: Secondary | ICD-10-CM

## 2020-10-23 DIAGNOSIS — D219 Benign neoplasm of connective and other soft tissue, unspecified: Secondary | ICD-10-CM

## 2020-10-23 DIAGNOSIS — N951 Menopausal and female climacteric states: Secondary | ICD-10-CM | POA: Diagnosis not present

## 2020-10-23 DIAGNOSIS — M79604 Pain in right leg: Secondary | ICD-10-CM

## 2020-10-23 DIAGNOSIS — M25551 Pain in right hip: Secondary | ICD-10-CM

## 2020-10-23 DIAGNOSIS — R102 Pelvic and perineal pain: Secondary | ICD-10-CM

## 2020-10-23 DIAGNOSIS — M25552 Pain in left hip: Secondary | ICD-10-CM

## 2020-10-23 NOTE — Progress Notes (Signed)
    Emily Weaver 1970/08/23 [id number]        50 y.o.  G2P2L2  RP: Back pain radiating to both hips  HPI: Lower back pain radiating to both hips.  Some numbness or tingling in the toes.  Worst when driving/sitting for a while.  Joint pains worst in the morning.  Perimenopausal with menses spacing >6 months.  LMPs 11/2019, 08/2020.  S/P Bilateral Salpingectomy/Left Oophorectomy for Endometriosis.  H/O small uterine Fibroids.  Hot flushes and night sweats present.     OB History  Gravida Para Term Preterm AB Living  '2 2       2  '$ SAB IAB Ectopic Multiple Live Births               # Outcome Date GA Lbr Len/2nd Weight Sex Delivery Anes PTL Lv  2 Para           1 Para             Past medical history,surgical history, problem list, medications, allergies, family history and social history were all reviewed and documented in the EPIC chart.   Directed ROS with pertinent positives and negatives documented in the history of present illness/assessment and plan.  Exam:  Vitals:   10/23/20 1328  BP: 120/80  Pulse: 76  Resp: 16  Temp: 98.2 F (36.8 C)  TempSrc: Oral   General appearance:  Normal  Abdomen: Normal  Gynecologic exam: Vulva normal.  Bimanual exam:  Uterus AV, mobile, normal volume, NT.  No adnexal mass felt, NT bilaterally.   Assessment/Plan:  50 y.o. [name]   1. Pelvic pain Mainly lower back pain with some radiation to the pelvis, but mainly to the bilateral hips.  Now in perimenopause, but history of endometriosis and small uterine fibroids.  Normal gynecologic exam today.  We will complete the investigation with a pelvic ultrasound at follow-up.  Status post bilateral salpingectomy and left oophorectomy. - US Transvaginal Non-OB; Future  2. Fibroids Small uterine fibroids per pelvic ultrasound in 08/2019. - US Transvaginal Non-OB; Future  3. Perimenopause Oligomenorrhea with hot flushes.  Will check FSH level today. - FSH  4. Hip pain,  bilateral Bilateral hip pain worse in the morning.  Rheumatoid factor drawn today. - Rheumatoid Factor  5. Lumbar pain with radiation down right leg Lumbar pain radiating to the right foot with some numbness of the toes.  MRI of the lumbar spine showed mild bulging of the disc between L4-L5 in 2018.  Recommend repeating an MRI of the lumbar spine.  Other orders - Naproxen Sodium (ALEVE PO); Aleve  prn - Multiple Vitamins-Minerals (ICAPS PO); I-Caps   [name] MD, 1:51 PM 10/23/2020

## 2020-10-25 DIAGNOSIS — G8929 Other chronic pain: Secondary | ICD-10-CM | POA: Diagnosis not present

## 2020-10-25 DIAGNOSIS — Z23 Encounter for immunization: Secondary | ICD-10-CM | POA: Diagnosis not present

## 2020-10-25 DIAGNOSIS — M533 Sacrococcygeal disorders, not elsewhere classified: Secondary | ICD-10-CM | POA: Diagnosis not present

## 2020-10-25 DIAGNOSIS — M2550 Pain in unspecified joint: Secondary | ICD-10-CM | POA: Diagnosis not present

## 2020-10-27 ENCOUNTER — Encounter: Payer: Self-pay | Admitting: Obstetrics & Gynecology

## 2020-10-30 ENCOUNTER — Other Ambulatory Visit: Payer: Self-pay | Admitting: Internal Medicine

## 2020-10-30 DIAGNOSIS — M533 Sacrococcygeal disorders, not elsewhere classified: Secondary | ICD-10-CM

## 2020-11-15 ENCOUNTER — Encounter: Payer: Self-pay | Admitting: Obstetrics & Gynecology

## 2020-11-15 DIAGNOSIS — Z1231 Encounter for screening mammogram for malignant neoplasm of breast: Secondary | ICD-10-CM | POA: Diagnosis not present

## 2020-11-16 DIAGNOSIS — M7062 Trochanteric bursitis, left hip: Secondary | ICD-10-CM | POA: Diagnosis not present

## 2020-11-16 DIAGNOSIS — M7061 Trochanteric bursitis, right hip: Secondary | ICD-10-CM | POA: Diagnosis not present

## 2020-11-20 ENCOUNTER — Ambulatory Visit
Admission: RE | Admit: 2020-11-20 | Discharge: 2020-11-20 | Disposition: A | Discharge status: Home / Self Care | Payer: BC Managed Care – PPO | Source: Ambulatory Visit | Attending: Internal Medicine | Admitting: Internal Medicine

## 2020-11-20 ENCOUNTER — Other Ambulatory Visit: Payer: Self-pay

## 2020-11-20 DIAGNOSIS — D259 Leiomyoma of uterus, unspecified: Secondary | ICD-10-CM | POA: Diagnosis not present

## 2020-11-20 DIAGNOSIS — M5126 Other intervertebral disc displacement, lumbar region: Secondary | ICD-10-CM | POA: Diagnosis not present

## 2020-11-20 DIAGNOSIS — M533 Sacrococcygeal disorders, not elsewhere classified: Secondary | ICD-10-CM

## 2020-11-20 DIAGNOSIS — M5136 Other intervertebral disc degeneration, lumbar region: Secondary | ICD-10-CM | POA: Diagnosis not present

## 2020-11-29 ENCOUNTER — Ambulatory Visit (INDEPENDENT_AMBULATORY_CARE_PROVIDER_SITE_OTHER): Payer: BC Managed Care – PPO | Admitting: Obstetrics & Gynecology

## 2020-11-29 ENCOUNTER — Other Ambulatory Visit: Payer: Self-pay

## 2020-11-29 ENCOUNTER — Ambulatory Visit (INDEPENDENT_AMBULATORY_CARE_PROVIDER_SITE_OTHER): Payer: BC Managed Care – PPO

## 2020-11-29 ENCOUNTER — Encounter: Payer: Self-pay | Admitting: Obstetrics & Gynecology

## 2020-11-29 VITALS — BP 120/70

## 2020-11-29 DIAGNOSIS — D219 Benign neoplasm of connective and other soft tissue, unspecified: Secondary | ICD-10-CM | POA: Diagnosis not present

## 2020-11-29 DIAGNOSIS — M79604 Pain in right leg: Secondary | ICD-10-CM

## 2020-11-29 DIAGNOSIS — M25551 Pain in right hip: Secondary | ICD-10-CM

## 2020-11-29 DIAGNOSIS — R102 Pelvic and perineal pain: Secondary | ICD-10-CM

## 2020-11-29 DIAGNOSIS — N951 Menopausal and female climacteric states: Secondary | ICD-10-CM

## 2020-11-29 DIAGNOSIS — M545 Low back pain, unspecified: Secondary | ICD-10-CM

## 2020-11-29 DIAGNOSIS — M25552 Pain in left hip: Secondary | ICD-10-CM

## 2020-11-29 MED ORDER — PROGESTERONE MICRONIZED 100 MG PO CAPS: 100.0000 mg | ORAL_CAPSULE | Freq: Every day | ORAL | 4 refills | Status: DC

## 2020-11-29 MED ORDER — ESTRADIOL 0.05 MG/24HR TD PTTW: 1.0000 | MEDICATED_PATCH | TRANSDERMAL | 4 refills | Status: DC

## 2020-11-29 NOTE — Progress Notes (Signed)
    Emily Weaver [birth year] [id number]        50 y.o.  G2P2L2  RP: Pelvic pain for Pelvic US  HPI: Perimenopause with LMPs 11/2019, then 08/2020.  Hot flushes and difficulty sleeping.  S/P Bilateral Salpingectomy/Left Oophorectomy for Endometriosis. H/O small uterine fibroids.  Seen on 10/23/2020, no change since then: Lower back pain radiating to both hips.  Some numbness or tingling in the toes.  Worst when driving/sitting for a while.  Joint pains worst in the morning.    OB History  Gravida Para Term Preterm AB Living  2 2       2   SAB IAB Ectopic Multiple Live Births               # Outcome Date GA Lbr Len/2nd Weight Sex Delivery Anes PTL Lv  2 Para           1 Para             Past medical history,surgical history, problem list, medications, allergies, family history and social history were all reviewed and documented in the EPIC chart.   Directed ROS with pertinent positives and negatives documented in the history of present illness/assessment and plan.  Exam:  Vitals:   11/29/20 1030  BP: 120/70   General appearance:  Normal  Pelvic US today: T/V images.  Retroverted uterus overall normal in size measured at 9.35 x 5.98 x 5.58 cm.  Intramural fibroid measured at 2.7 cm.  Subserosal fibroids, the largest measured at 1.9 cm.  1 pedunculated right lateral fibroid measuring 1.7 cm.  No significant change since previous scan on August 18, 2019.  Thin symmetrical endometrial lining measured at 4.16 mm.  No mass or thickening seen.  Left ovary is surgically absent.  Right ovary is atrophic with few remaining follicles seen.  No adnexal mass.  Trace free fluid in the pelvis.  Prominent colon wall seen in the left adnexa with history of diverticulitis.  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   Assessment/Plan:  50 y.o. [name]   1. Pelvic pain Probably d/t lumbar disc disease.  Pelvic US findings reviewed with patient.   Reassured.  2. Perimenopause Symptomatic perimenopause.  No CI to HRT.  Counseling on HRT done.  Decision to start on Estradiol patch 0.05 twice a week and Progesterone 100 mg PO HS.  Usage reviewed and prescriptions sent to pharmacy.  3. 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  Has an appointment with Dr Orland Mustard.  4. Hip pain, bilateral Recent MRI wnl for hip joints.  Other orders - estradiol (VIVELLE-DOT) 0.05 MG/24HR patch; Place 1 patch (0.05 mg total) onto the skin 2 (two) times a week. - progesterone (PROMETRIUM) 100 MG capsule; Take 1 capsule (100 mg total) by mouth at bedtime.   [name] MD, 10:34 AM 11/29/2020

## 2020-12-12 DIAGNOSIS — M5416 Radiculopathy, lumbar region: Secondary | ICD-10-CM | POA: Diagnosis not present

## 2021-01-21 DIAGNOSIS — M5416 Radiculopathy, lumbar region: Secondary | ICD-10-CM | POA: Diagnosis not present

## 2021-02-01 ENCOUNTER — Ambulatory Visit: Payer: BC Managed Care – PPO | Admitting: Podiatry

## 2021-02-13 DIAGNOSIS — M5416 Radiculopathy, lumbar region: Secondary | ICD-10-CM | POA: Diagnosis not present

## 2021-02-15 ENCOUNTER — Other Ambulatory Visit: Payer: Self-pay

## 2021-02-15 ENCOUNTER — Ambulatory Visit (INDEPENDENT_AMBULATORY_CARE_PROVIDER_SITE_OTHER): Payer: BC Managed Care – PPO | Admitting: Podiatry

## 2021-02-15 DIAGNOSIS — G5782 Other specified mononeuropathies of left lower limb: Secondary | ICD-10-CM

## 2021-02-19 NOTE — Progress Notes (Signed)
°  Subjective:  Patient ID: Emily Weaver, female    DOB: 1971-02-04,  MRN: [id number]  Chief Complaint  Patient presents with   Toe Pain    Left foot     51 y.o. female presents with the above complaint.  Patient presents with complaint of left third separation of the toe.  This has been going to the third interdigital space.  She states it hurts with ambulation very mild in nature.  She is mostly noticing separation of the toe.  She wanted get it evaluated.  She has not seen MRIs prior to seeing me.  She would like to discuss treatment options for this.  She has not tried any treatment options for it.  Pain with ambulation mildly.  There is a burning sensation to her.   Review of Systems: Negative except as noted in the HPI. Denies N/V/F/Ch.  Past Medical History:  Diagnosis Date   Stress incontinence     Current Outpatient Medications:    Cyanocobalamin (B-12) 1000 MCG SUBL, Place 1,000 mcg under the tongue daily., Disp: , Rfl:    estradiol (VIVELLE-DOT) 0.05 MG/24HR patch, Place 1 patch (0.05 mg total) onto the skin 2 (two) times a week., Disp: 24 patch, Rfl: 4   Melatonin 5 MG TABS, Take 5 mg by mouth at bedtime as needed (SLEEP)., Disp: , Rfl:    Multiple Vitamin (MULTIVITAMIN) tablet, Take 1 tablet by mouth daily., Disp: , Rfl:    Multiple Vitamins-Minerals (ICAPS PO), I-Caps, Disp: , Rfl:    Naproxen Sodium (ALEVE PO), Aleve  prn, Disp: , Rfl:    progesterone (PROMETRIUM) 100 MG capsule, Take 1 capsule (100 mg total) by mouth at bedtime., Disp: 90 capsule, Rfl: 4   valACYclovir (VALTREX) 500 MG tablet, Take 500 mg by mouth as needed., Disp: , Rfl:   Social History   Tobacco Use  Smoking Status Never  Smokeless Tobacco Never    No Known Allergies Objective:  There were no vitals filed for this visit. There is no height or weight on file to calculate BMI. Constitutional Well developed. Well nourished.  Vascular Dorsalis pedis pulses palpable bilaterally. Posterior  tibial pulses palpable bilaterally. Capillary refill normal to all digits.  No cyanosis or clubbing noted. Pedal hair growth normal.  Neurologic Normal speech. Oriented to person, place, and time. Epicritic sensation to light touch grossly present bilaterally.  Dermatologic Nails well groomed and normal in appearance. No open wounds. No skin lesions.  Orthopedic: Positive [place] Canal sign to left third interdigital space.  Palpable click noted/Morton's click noted left third interspace.  Burning shooting tingling noted.   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  Left third interspace space neuroma -All questions and concerns were discussed with the patient in extensive detail -Given that this is very mild in nature with biggest complaint being sudden onset I discussed with the patient shoe gear modification as well as padding protecting.  We will hold off on any kind of injections at this time.  If it gets worse I discussed with her to come see me and we can discuss treatment options for this.  She states understanding.  No follow-ups on file.

## 2021-04-26 ENCOUNTER — Ambulatory Visit (INDEPENDENT_AMBULATORY_CARE_PROVIDER_SITE_OTHER): Payer: BC Managed Care – PPO | Admitting: Obstetrics & Gynecology

## 2021-04-26 ENCOUNTER — Other Ambulatory Visit: Payer: Self-pay

## 2021-04-26 ENCOUNTER — Encounter: Payer: Self-pay | Admitting: Obstetrics & Gynecology

## 2021-04-26 VITALS — BP 116/72 | HR 78 | Resp 16 | Ht 62.5 in | Wt 164.0 lb

## 2021-04-26 DIAGNOSIS — Z01419 Encounter for gynecological examination (general) (routine) without abnormal findings: Secondary | ICD-10-CM | POA: Diagnosis not present

## 2021-04-26 DIAGNOSIS — E663 Overweight: Secondary | ICD-10-CM

## 2021-04-26 DIAGNOSIS — Z9079 Acquired absence of other genital organ(s): Secondary | ICD-10-CM

## 2021-04-26 DIAGNOSIS — N951 Menopausal and female climacteric states: Secondary | ICD-10-CM

## 2021-04-26 NOTE — Progress Notes (Signed)
? ? ?Emily Weaver December 19, 1970 [id number] ? ? ?History:    51 y.o. G2P2L2 Married ?  ?RP:  Established patient presenting for annual gyn exam  ?  ?HPI:  H/O Endometriosis, S/P Bilateral Salpingectomy and Left Oophorectomy.  Oligomenorrhea with LMP 04/20/2021.  Perimenopausal.  Took HRT x 2 months, stopped it in 01/2021 because of side effects.  No pelvic pain.  No pain with IC.  Pap Neg 04/2020.  Breasts normal.  Mammo Neg 11/2020.  BMI 29.52.  Exercising regularly with [employer] programs.  Difficulty loosing weight.  Health labs wnl with Fam MD.  Cologard Negative in 2020.  BD 2020-21 normal per patient. ? ? ?Past medical history,surgical history, family history and social history were all reviewed and documented in the EPIC chart. ? ?Gynecologic History ?Patient's last menstrual period was 04/20/2021. ? ?Obstetric History ?OB History  ?Gravida Para Term Preterm AB Living  ?'2 2       2  '$ ?SAB IAB Ectopic Multiple Live Births  ?           ?  ?# Outcome Date GA Lbr Len/2nd Weight Sex Delivery Anes PTL Lv  ?2 Para           ?1 Para           ? ? ? ?ROS: A ROS was performed and pertinent positives and negatives are included in the history. ? GENERAL: No fevers or chills. HEENT: No change in vision, no earache, sore throat or sinus congestion. NECK: No pain or stiffness. CARDIOVASCULAR: No chest pain or pressure. No palpitations. PULMONARY: No shortness of breath, cough or wheeze. GASTROINTESTINAL: No abdominal pain, nausea, vomiting or diarrhea, melena or bright red blood per rectum. GENITOURINARY: No urinary frequency, urgency, hesitancy or dysuria. MUSCULOSKELETAL: No joint or muscle pain, no back pain, no recent trauma. DERMATOLOGIC: No rash, no itching, no lesions. ENDOCRINE: No polyuria, polydipsia, no heat or cold intolerance. No recent change in weight. HEMATOLOGICAL: No anemia or easy bruising or bleeding. NEUROLOGIC: No headache, seizures, numbness, tingling or weakness. PSYCHIATRIC: No depression, no loss of  interest in normal activity or change in sleep pattern.  ?  ? ?Exam: ? ? ?BP 116/72   Pulse 78   Resp 16   Ht 5' 2.5" (1.588 m)   Wt 164 lb (74.4 kg)   LMP 04/20/2021 Comment: BTL  BMI 29.52 kg/m?  ? ?Body mass index is 29.52 kg/m?. ? ?General appearance : Well developed well nourished female. No acute distress ?HEENT: Eyes: no retinal hemorrhage or exudates,  Neck supple, trachea midline, no carotid bruits, no thyroidmegaly ?Lungs: Clear to auscultation, no rhonchi or wheezes, or rib retractions  ?Heart: Regular rate and rhythm, no murmurs or gallops ?Breast:Examined in sitting and supine position were symmetrical in appearance, no palpable masses or tenderness,  no skin retraction, no nipple inversion, no nipple discharge, no skin discoloration, no axillary or supraclavicular lymphadenopathy ?Abdomen: no palpable masses or tenderness, no rebound or guarding ?Extremities: no edema or skin discoloration or tenderness ? ?Pelvic: Vulva: Normal ?            Vagina: No gross lesions or discharge ? Cervix: No gross lesions or discharge ? Uterus  AV, normal size, shape and consistency, non-tender and mobile ? Adnexa  Without masses or tenderness ? Anus: Normal ? ?Pelvic US 11/29/2020: T/V images.  Retroverted uterus overall normal in size measured at 9.35 x 5.98 x 5.58 cm.  Intramural fibroid measured at 2.7 cm.  Subserosal fibroids, the  largest measured at 1.9 cm.  1 pedunculated right lateral fibroid measuring 1.7 cm.  No significant change since previous scan on August 18, 2019.  Thin symmetrical endometrial lining measured at 4.16 mm.  No mass or thickening seen.  Left ovary is surgically absent.  Right ovary is atrophic with few remaining follicles seen.  No adnexal mass.  Trace free fluid in the pelvis.  Prominent colon wall seen in the left adnexa with history of diverticulitis. ?  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 ? ? ?Assessment/Plan:  51 y.o. female  for annual exam  ? ?1. Well female exam with routine gynecological exam ?H/O Endometriosis, S/P Bilateral Salpingectomy and Left Oophorectomy.  Oligomenorrhea with LMP 04/20/2021.  Perimenopausal.  Took HRT x 2 months, stopped it in 01/2021 because of side effects.  No pelvic pain.  No pain with IC.  Pap Neg 04/2020.  Breasts normal.  Mammo Neg 11/2020.  BMI 29.52.  Exercising regularly with [employer] programs.  Difficulty loosing weight.  Health labs wnl with Fam MD.  Cologard Negative in 2020.  BD 2020-21 normal per patient. ? ?2. Perimenopause ?Oligomenorrhea with LMP 04/20/2021.  Perimenopausal.  Took HRT x 2 months, stopped it in 01/2021 because of side effects.  No pelvic pain.  No pain with IC. Will observe at this time.  Abnormal vaginal bleeding precautions discussed. ? ?3. Status post bilateral salpingectomy ? ?4. Overweight (BMI 25.0-29.9)  ?Low calorie/carb diet.  Continue with fitness. ? ?[name] MD, 11:45 AM 04/26/2021 ? ?  ?

## 2021-05-06 DIAGNOSIS — M170 Bilateral primary osteoarthritis of knee: Secondary | ICD-10-CM | POA: Diagnosis not present

## 2021-06-18 DIAGNOSIS — R7989 Other specified abnormal findings of blood chemistry: Secondary | ICD-10-CM | POA: Diagnosis not present

## 2021-06-18 DIAGNOSIS — D649 Anemia, unspecified: Secondary | ICD-10-CM | POA: Diagnosis not present

## 2021-06-18 DIAGNOSIS — E538 Deficiency of other specified B group vitamins: Secondary | ICD-10-CM | POA: Diagnosis not present

## 2021-06-25 DIAGNOSIS — M25562 Pain in left knee: Secondary | ICD-10-CM | POA: Diagnosis not present

## 2021-06-25 DIAGNOSIS — Z1339 Encounter for screening examination for other mental health and behavioral disorders: Secondary | ICD-10-CM | POA: Diagnosis not present

## 2021-06-25 DIAGNOSIS — E663 Overweight: Secondary | ICD-10-CM | POA: Diagnosis not present

## 2021-06-25 DIAGNOSIS — Z0000 Encounter for general adult medical examination without abnormal findings: Secondary | ICD-10-CM | POA: Diagnosis not present

## 2021-06-25 DIAGNOSIS — Z1331 Encounter for screening for depression: Secondary | ICD-10-CM | POA: Diagnosis not present

## 2021-06-25 DIAGNOSIS — R82998 Other abnormal findings in urine: Secondary | ICD-10-CM | POA: Diagnosis not present

## 2021-07-09 DIAGNOSIS — Z1212 Encounter for screening for malignant neoplasm of rectum: Secondary | ICD-10-CM | POA: Diagnosis not present

## 2021-07-09 DIAGNOSIS — Z1211 Encounter for screening for malignant neoplasm of colon: Secondary | ICD-10-CM | POA: Diagnosis not present

## 2021-07-10 DIAGNOSIS — L812 Freckles: Secondary | ICD-10-CM | POA: Diagnosis not present

## 2021-07-10 DIAGNOSIS — L821 Other seborrheic keratosis: Secondary | ICD-10-CM | POA: Diagnosis not present

## 2021-07-10 DIAGNOSIS — L814 Other melanin hyperpigmentation: Secondary | ICD-10-CM | POA: Diagnosis not present

## 2021-07-10 DIAGNOSIS — L718 Other rosacea: Secondary | ICD-10-CM | POA: Diagnosis not present

## 2021-07-17 LAB — COLOGUARD: COLOGUARD: NEGATIVE

## 2021-11-21 DIAGNOSIS — Z1231 Encounter for screening mammogram for malignant neoplasm of breast: Secondary | ICD-10-CM | POA: Diagnosis not present

## 2021-11-25 ENCOUNTER — Encounter: Payer: Self-pay | Admitting: Obstetrics & Gynecology

## 2021-11-29 DIAGNOSIS — Z6828 Body mass index (BMI) 28.0-28.9, adult: Secondary | ICD-10-CM | POA: Diagnosis not present

## 2021-11-29 DIAGNOSIS — J302 Other seasonal allergic rhinitis: Secondary | ICD-10-CM | POA: Diagnosis not present

## 2021-11-29 DIAGNOSIS — J029 Acute pharyngitis, unspecified: Secondary | ICD-10-CM | POA: Diagnosis not present

## 2021-11-29 DIAGNOSIS — Z23 Encounter for immunization: Secondary | ICD-10-CM | POA: Diagnosis not present

## 2021-12-05 DIAGNOSIS — N6489 Other specified disorders of breast: Secondary | ICD-10-CM | POA: Diagnosis not present

## 2021-12-05 DIAGNOSIS — R928 Other abnormal and inconclusive findings on diagnostic imaging of breast: Secondary | ICD-10-CM | POA: Diagnosis not present

## 2021-12-09 ENCOUNTER — Encounter: Payer: Self-pay | Admitting: Obstetrics & Gynecology

## 2022-02-04 DIAGNOSIS — M5416 Radiculopathy, lumbar region: Secondary | ICD-10-CM | POA: Diagnosis not present

## 2022-04-28 DIAGNOSIS — M170 Bilateral primary osteoarthritis of knee: Secondary | ICD-10-CM | POA: Diagnosis not present

## 2022-04-29 ENCOUNTER — Encounter: Payer: Self-pay | Admitting: Obstetrics & Gynecology

## 2022-04-29 ENCOUNTER — Ambulatory Visit (INDEPENDENT_AMBULATORY_CARE_PROVIDER_SITE_OTHER): Payer: BC Managed Care – PPO | Admitting: Obstetrics & Gynecology

## 2022-04-29 VITALS — BP 130/84 | HR 106 | Ht 62.25 in | Wt 167.0 lb

## 2022-04-29 DIAGNOSIS — Z01419 Encounter for gynecological examination (general) (routine) without abnormal findings: Secondary | ICD-10-CM

## 2022-04-29 DIAGNOSIS — Z780 Asymptomatic menopausal state: Secondary | ICD-10-CM

## 2022-04-29 DIAGNOSIS — Z9079 Acquired absence of other genital organ(s): Secondary | ICD-10-CM | POA: Diagnosis not present

## 2022-04-29 NOTE — Progress Notes (Signed)
Emily Weaver [birth year] [id number]   History:    52 y.o. G2P2L2 Married.  Husband with a spontaneous spinal fluid leak/brain hematoma.  Son with unexplained left ear loss.   RP:  Established patient presenting for annual gyn exam    HPI:  Postmenopause, well on no HRT.  No PMB.  H/O Endometriosis, S/P Bilateral Salpingectomy and Left Oophorectomy.  No pelvic pain.  No pain with IC.  Pap Neg 04/2020. No h/o abnormal Pap.  Repeat Pap at 3 years. Breasts normal.  Mammo Lt Neg 11/2021, Rt Dx mammo/US Neg.  BMI 30.3. Exercising regularly with [employer] programs. Difficulty loosing weight.  Health labs wnl with Fam MD. Cologard Negative in 2023.  BD 2020-21 normal per patient.    Past medical history,surgical history, family history and social history were all reviewed and documented in the EPIC chart.  Gynecologic History Patient's last menstrual period was 04/20/2021.  Obstetric History OB History  Gravida Para Term Preterm AB Living  2 2 2     2   SAB IAB Ectopic Multiple Live Births               # Outcome Date GA Lbr Len/2nd Weight Sex Delivery Anes PTL Lv  2 Term           1 Term              ROS: A ROS was performed and pertinent positives and negatives are included in the history. GENERAL: No fevers or chills. HEENT: No change in vision, no earache, sore throat or sinus congestion. NECK: No pain or stiffness. CARDIOVASCULAR: No chest pain or pressure. No palpitations. PULMONARY: No shortness of breath, cough or wheeze. GASTROINTESTINAL: No abdominal pain, nausea, vomiting or diarrhea, melena or bright red blood per rectum. GENITOURINARY: No urinary frequency, urgency, hesitancy or dysuria. MUSCULOSKELETAL: No joint or muscle pain, no back pain, no recent trauma. DERMATOLOGIC: No rash, no itching, no lesions. ENDOCRINE: No polyuria, polydipsia, no heat or cold intolerance. No recent change in weight. HEMATOLOGICAL: No anemia or easy bruising or bleeding. NEUROLOGIC: No headache,  seizures, numbness, tingling or weakness. PSYCHIATRIC: No depression, no loss of interest in normal activity or change in sleep pattern.     Exam:   BP 130/84   Pulse (!) 106   Ht 5' 2.25" (1.581 m)   Wt 167 lb (75.8 kg)   LMP 04/20/2021 Comment: btl, sexually active  SpO2 97%   BMI 30.30 kg/m   Body mass index is 30.3 kg/m.  General appearance : Well developed well nourished female. No acute distress HEENT: Eyes: no retinal hemorrhage or exudates,  Neck supple, trachea midline, no carotid bruits, no thyroidmegaly Lungs: Clear to auscultation, no rhonchi or wheezes, or rib retractions  Heart: Regular rate and rhythm, no murmurs or gallops Breast:Examined in sitting and supine position were symmetrical in appearance, no palpable masses or tenderness,  no skin retraction, no nipple inversion, no nipple discharge, no skin discoloration, no axillary or supraclavicular lymphadenopathy Abdomen: no palpable masses or tenderness, no rebound or guarding Extremities: no edema or skin discoloration or tenderness  Pelvic: Vulva: Normal             Vagina: No gross lesions or discharge  Cervix: No gross lesions or discharge  Uterus  AV, normal size, shape and consistency, non-tender and mobile  Adnexa  Without masses or tenderness  Anus: Normal   Assessment/Plan:  52 y.o. female for annual exam   1. Well female exam  with routine gynecological exam Postmenopause, well on no HRT.  No PMB.  H/O Endometriosis, S/P Bilateral Salpingectomy and Left Oophorectomy.  No pelvic pain.  No pain with IC.  Pap Neg 04/2020. No h/o abnormal Pap.  Repeat Pap at 3 years. Breasts normal.  Mammo Lt Neg 11/2021, Rt Dx mammo/US Neg.  BMI 30.3. Exercising regularly with [employer] programs. Difficulty loosing weight.  Health labs wnl with Fam MD. Cologard Negative in 2023.  BD 2020-21 normal per patient.  2. Postmenopause Postmenopause, well on no HRT.  No PMB.  H/O Endometriosis, S/P Bilateral Salpingectomy and Left  Oophorectomy.  No pelvic pain.  No pain with IC.    3. Status post bilateral salpingectomy/Lt Oophorectomy  Other orders - metroNIDAZOLE (METROGEL) 0.75 % gel; Apply 1 Application topically 2 (two) times daily. - Probiotic Product (PROBIOTIC PO); Take by mouth.   [name] MD, 10:10 AM

## 2022-06-23 DIAGNOSIS — G8929 Other chronic pain: Secondary | ICD-10-CM | POA: Diagnosis not present

## 2022-06-23 DIAGNOSIS — M25561 Pain in right knee: Secondary | ICD-10-CM | POA: Diagnosis not present

## 2022-06-26 IMAGING — MR MR SACRUM / SI JOINTS WO CM
4 of 5 series · 35 of 48 positions shown · non-contrast
Comparison: None.

CLINICAL DATA: Sacral pain and lower back pain

EXAM:
MRI SACRUM WITHOUT CONTRAST
TECHNIQUE: Multiplanar multi-sequence MR imaging of the sacrum was performed.
No intravenous contrast was administered.

[Series 3: T1 · axial · 4.0mm · 0.52mm/px · z∈[-73,+58]mm · 11 of 30 slices shown (1 of 2)]
[im 1/30]
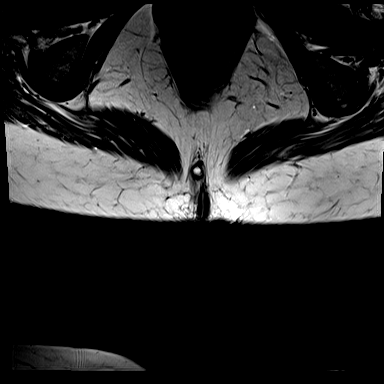
[im 3/30]
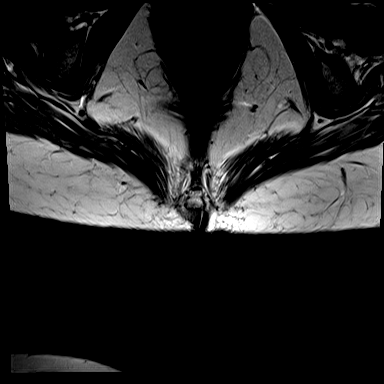
[im 6/30]
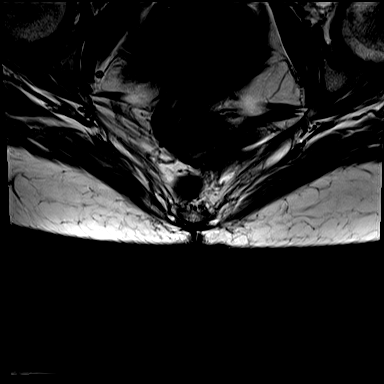
[im 9/30]
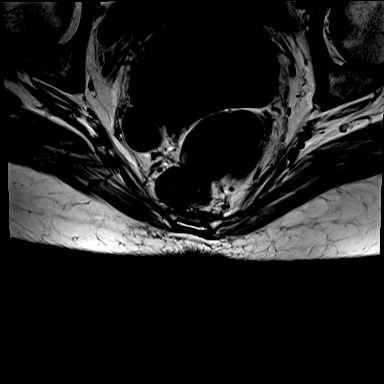
[im 12/30]
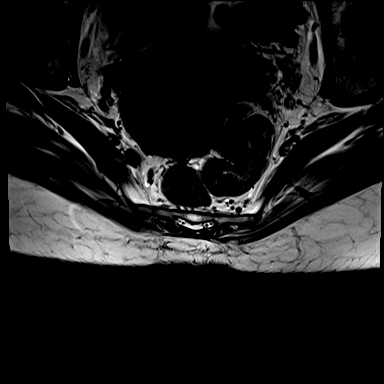
[im 15/30]
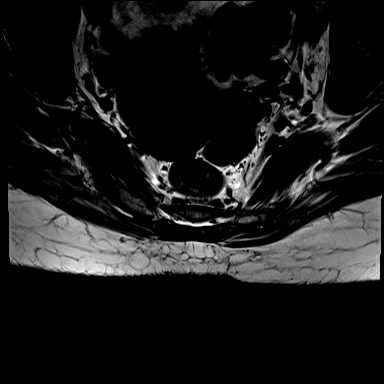
[im 18/30]
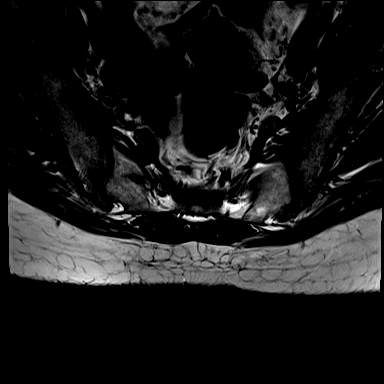
[im 21/30]
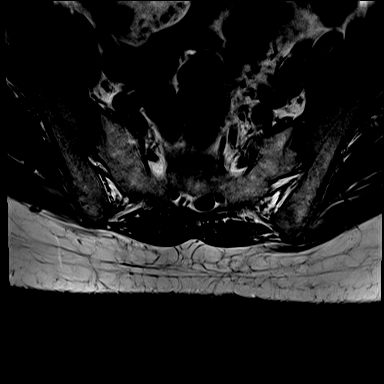
[im 24/30]
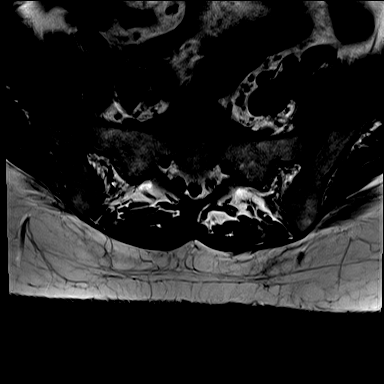
[im 27/30]
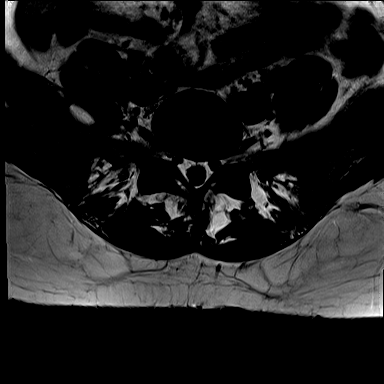
[im 30/30]
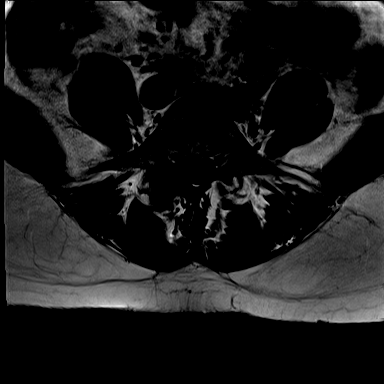

[Series 4: T2 fat-sat · axial · 4.0mm · 0.62mm/px · z∈[-73,+58]mm · 8 of 30 slices shown (1 of 2)]
[im 1/30]
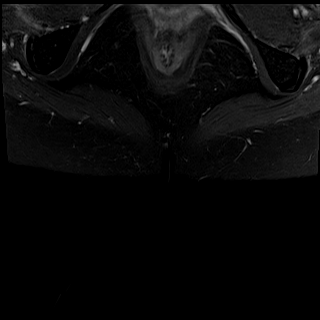
[im 4/30]
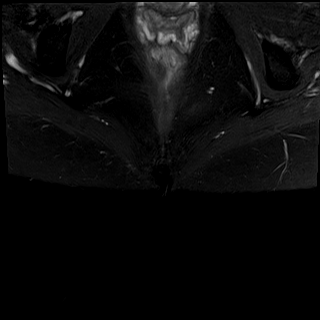
[im 10/30]
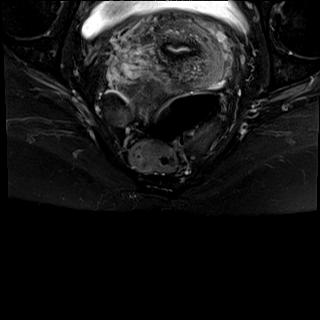
[im 13/30]
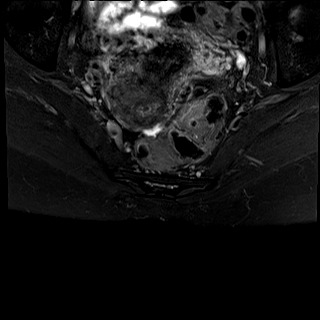
[im 17/30]
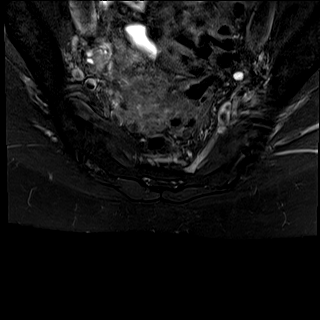
[im 20/30]
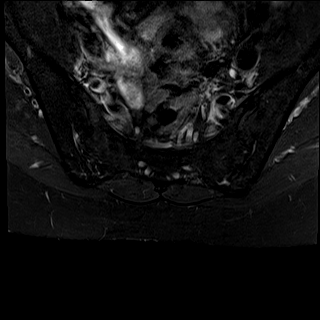
[im 26/30]
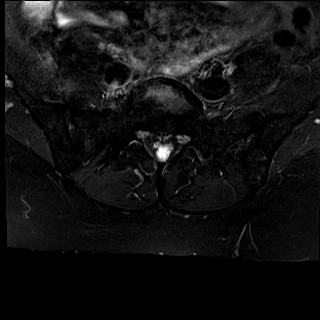
[im 30/30]
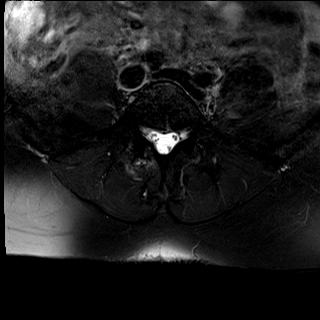

[Series 5: T2 fat-sat · sagittal · 4.0mm · 0.75mm/px · 11 of 32 slices shown (2 of 2)]
[im 1/32]
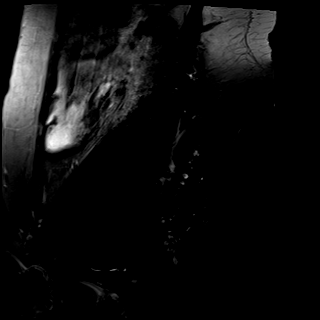
[im 4/32]
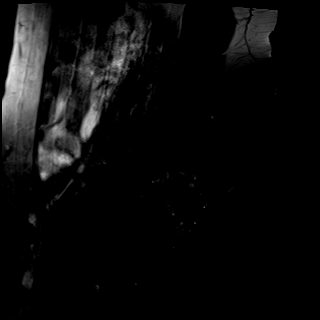
[im 7/32]
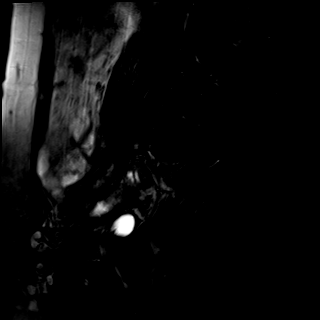
[im 10/32]
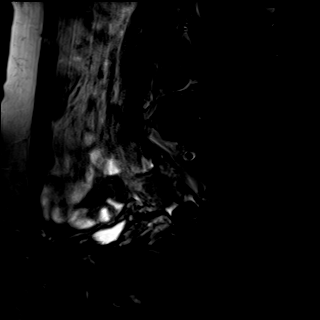
[im 13/32]
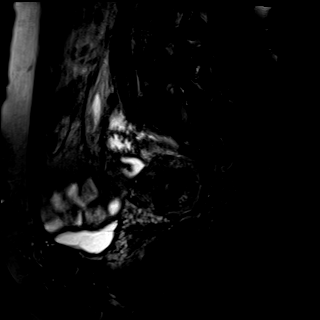
[im 16/32]
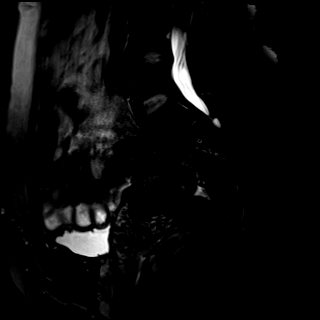
[im 19/32]
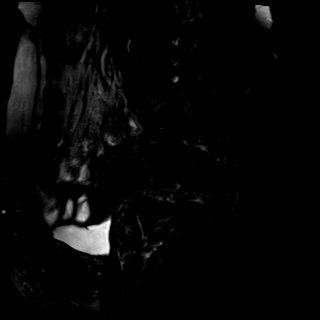
[im 22/32]
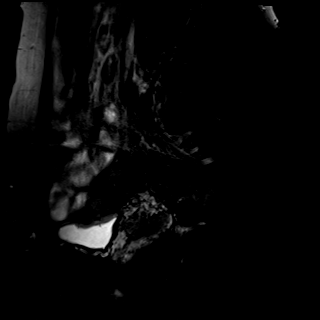
[im 25/32]
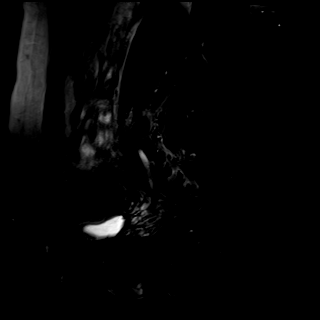
[im 28/32]
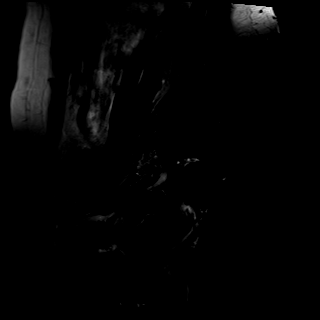
[im 32/32]
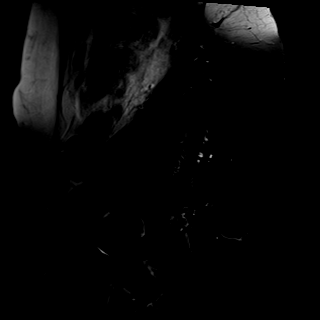

[Series 6: T1 · oblique · 4.0mm · 0.49mm/px · 5 of 23 slices shown (2 of 2)]
[im 1/23]
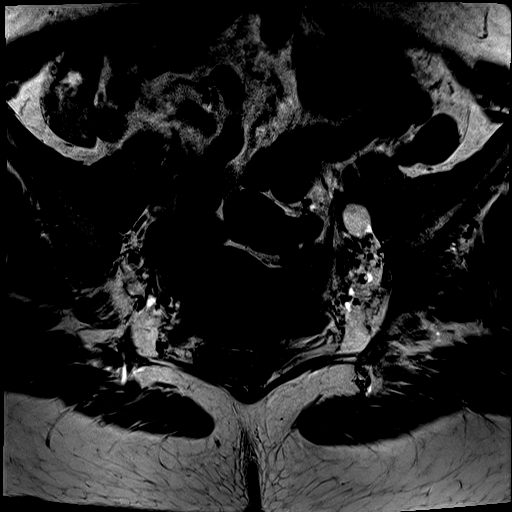
[im 4/23]
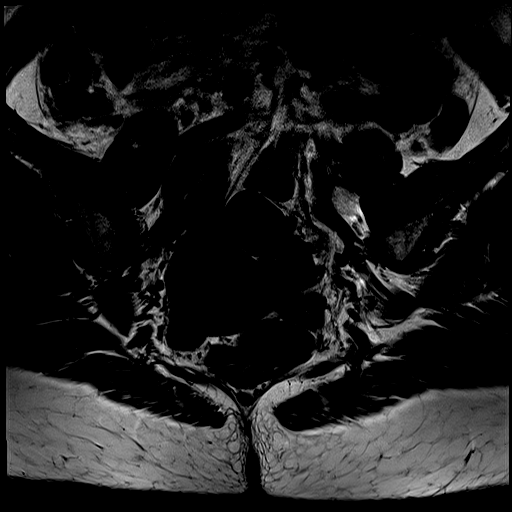
[im 7/23]
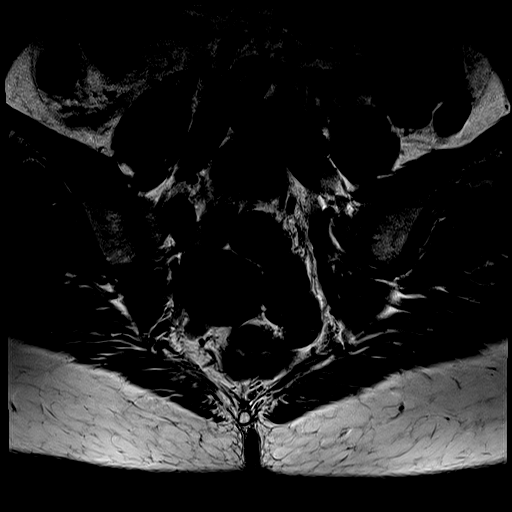
[im 13/23]
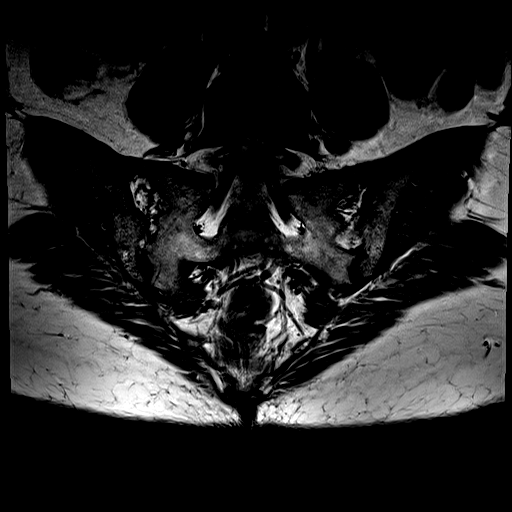
[im 19/23]
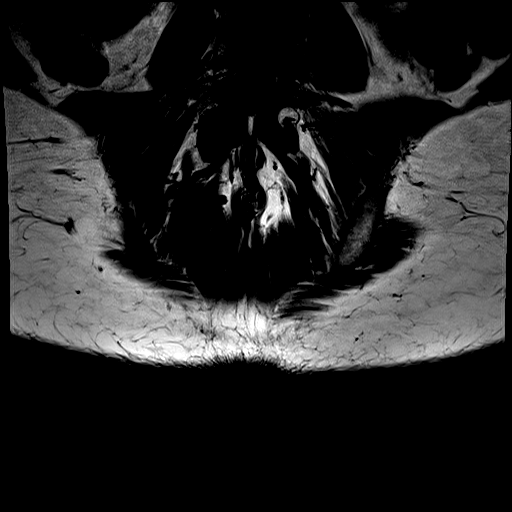

[35 of 48 positions shown; findings below may reference images not displayed]

FINDINGS: Bones/Joint/Cartilage

The cortex is intact. There is no significant marrow signal
alteration. The SI joints are symmetric without effusion, evidence
of erosion, or osseous fusion. No SI joint widening. There is disc
desiccation at L3-L4, L4-5, and L5-S1. There is mild disc bulging at
L3-L4 and L4-L5.

Ligaments

Grossly intact.

Muscles and Tendons
No muscle atrophy or edema.  No intramuscular collection.

Soft tissue
There are a few Tarlov cysts at the level of S1 on the right and S2
on the left.

Pelvic structures

Small nabothian cysts. Exophytic/pedunculated right posterolateral
uterine fibroid. Scattered sigmoid diverticuli.
IMPRESSION: No evidence of sacroiliitis.  No acute osseous abnormality.

Lower lumbar spine degenerative disc disease, with mild disc bulging
at L3-L4 and L4-L5. If clinically indicated, this could be further
evaluated with lumbar spine MRI.

## 2022-07-08 DIAGNOSIS — M25561 Pain in right knee: Secondary | ICD-10-CM | POA: Diagnosis not present

## 2022-07-09 ENCOUNTER — Other Ambulatory Visit (HOSPITAL_BASED_OUTPATIENT_CLINIC_OR_DEPARTMENT_OTHER): Payer: Self-pay | Admitting: Orthopedic Surgery

## 2022-07-09 DIAGNOSIS — M25561 Pain in right knee: Secondary | ICD-10-CM

## 2022-07-14 DIAGNOSIS — D649 Anemia, unspecified: Secondary | ICD-10-CM | POA: Diagnosis not present

## 2022-07-14 DIAGNOSIS — Z0000 Encounter for general adult medical examination without abnormal findings: Secondary | ICD-10-CM | POA: Diagnosis not present

## 2022-07-14 DIAGNOSIS — R7989 Other specified abnormal findings of blood chemistry: Secondary | ICD-10-CM | POA: Diagnosis not present

## 2022-07-14 DIAGNOSIS — E538 Deficiency of other specified B group vitamins: Secondary | ICD-10-CM | POA: Diagnosis not present

## 2022-07-16 ENCOUNTER — Ambulatory Visit (HOSPITAL_COMMUNITY)
Admission: RE | Admit: 2022-07-16 | Discharge: 2022-07-16 | Disposition: A | Discharge status: Home / Self Care | Payer: BC Managed Care – PPO | Source: Ambulatory Visit | Attending: Orthopedic Surgery | Admitting: Orthopedic Surgery

## 2022-07-16 DIAGNOSIS — M25561 Pain in right knee: Secondary | ICD-10-CM | POA: Diagnosis not present

## 2022-07-21 DIAGNOSIS — R7301 Impaired fasting glucose: Secondary | ICD-10-CM | POA: Diagnosis not present

## 2022-07-21 DIAGNOSIS — M25561 Pain in right knee: Secondary | ICD-10-CM | POA: Diagnosis not present

## 2022-07-21 DIAGNOSIS — R82998 Other abnormal findings in urine: Secondary | ICD-10-CM | POA: Diagnosis not present

## 2022-07-21 DIAGNOSIS — Z0000 Encounter for general adult medical examination without abnormal findings: Secondary | ICD-10-CM | POA: Diagnosis not present

## 2022-07-21 DIAGNOSIS — M25562 Pain in left knee: Secondary | ICD-10-CM | POA: Diagnosis not present

## 2022-07-22 DIAGNOSIS — Z1239 Encounter for other screening for malignant neoplasm of breast: Secondary | ICD-10-CM | POA: Diagnosis not present

## 2022-07-22 DIAGNOSIS — Z803 Family history of malignant neoplasm of breast: Secondary | ICD-10-CM | POA: Diagnosis not present

## 2022-07-22 DIAGNOSIS — R9230 Dense breasts, unspecified: Secondary | ICD-10-CM | POA: Diagnosis not present

## 2022-08-01 ENCOUNTER — Other Ambulatory Visit: Payer: Self-pay | Admitting: General Surgery

## 2022-08-01 DIAGNOSIS — R9230 Dense breasts, unspecified: Secondary | ICD-10-CM

## 2022-08-06 DIAGNOSIS — M25561 Pain in right knee: Secondary | ICD-10-CM | POA: Diagnosis not present

## 2022-08-20 DIAGNOSIS — M6751 Plica syndrome, right knee: Secondary | ICD-10-CM | POA: Diagnosis not present

## 2022-08-20 DIAGNOSIS — G8918 Other acute postprocedural pain: Secondary | ICD-10-CM | POA: Diagnosis not present

## 2022-08-20 DIAGNOSIS — M2241 Chondromalacia patellae, right knee: Secondary | ICD-10-CM | POA: Diagnosis not present

## 2022-08-20 DIAGNOSIS — M94261 Chondromalacia, right knee: Secondary | ICD-10-CM | POA: Diagnosis not present

## 2022-08-20 DIAGNOSIS — M65861 Other synovitis and tenosynovitis, right lower leg: Secondary | ICD-10-CM | POA: Diagnosis not present

## 2022-08-20 DIAGNOSIS — S83241A Other tear of medial meniscus, current injury, right knee, initial encounter: Secondary | ICD-10-CM | POA: Diagnosis not present

## 2022-08-20 DIAGNOSIS — M659 Synovitis and tenosynovitis, unspecified: Secondary | ICD-10-CM | POA: Diagnosis not present

## 2022-08-20 DIAGNOSIS — S83221A Peripheral tear of medial meniscus, current injury, right knee, initial encounter: Secondary | ICD-10-CM | POA: Diagnosis not present

## 2022-08-20 HISTORY — PX: OTHER SURGICAL HISTORY: SHX169

## 2022-08-26 DIAGNOSIS — M1711 Unilateral primary osteoarthritis, right knee: Secondary | ICD-10-CM | POA: Diagnosis not present

## 2022-08-26 DIAGNOSIS — Z9889 Other specified postprocedural states: Secondary | ICD-10-CM | POA: Diagnosis not present

## 2022-08-27 DIAGNOSIS — S83231A Complex tear of medial meniscus, current injury, right knee, initial encounter: Secondary | ICD-10-CM | POA: Diagnosis not present

## 2022-08-27 DIAGNOSIS — R269 Unspecified abnormalities of gait and mobility: Secondary | ICD-10-CM | POA: Diagnosis not present

## 2022-08-27 DIAGNOSIS — M6281 Muscle weakness (generalized): Secondary | ICD-10-CM | POA: Diagnosis not present

## 2022-08-29 DIAGNOSIS — M6281 Muscle weakness (generalized): Secondary | ICD-10-CM | POA: Diagnosis not present

## 2022-08-29 DIAGNOSIS — R269 Unspecified abnormalities of gait and mobility: Secondary | ICD-10-CM | POA: Diagnosis not present

## 2022-08-29 DIAGNOSIS — S83231A Complex tear of medial meniscus, current injury, right knee, initial encounter: Secondary | ICD-10-CM | POA: Diagnosis not present

## 2022-09-01 DIAGNOSIS — Z411 Encounter for cosmetic surgery: Secondary | ICD-10-CM | POA: Diagnosis not present

## 2022-09-01 DIAGNOSIS — L814 Other melanin hyperpigmentation: Secondary | ICD-10-CM | POA: Diagnosis not present

## 2022-09-01 DIAGNOSIS — L570 Actinic keratosis: Secondary | ICD-10-CM | POA: Diagnosis not present

## 2022-09-01 DIAGNOSIS — L739 Follicular disorder, unspecified: Secondary | ICD-10-CM | POA: Diagnosis not present

## 2022-09-01 DIAGNOSIS — B070 Plantar wart: Secondary | ICD-10-CM | POA: Diagnosis not present

## 2022-09-23 ENCOUNTER — Encounter: Payer: Self-pay | Admitting: General Surgery

## 2022-09-23 DIAGNOSIS — S83231A Complex tear of medial meniscus, current injury, right knee, initial encounter: Secondary | ICD-10-CM | POA: Diagnosis not present

## 2022-09-23 DIAGNOSIS — M6281 Muscle weakness (generalized): Secondary | ICD-10-CM | POA: Diagnosis not present

## 2022-09-23 DIAGNOSIS — R269 Unspecified abnormalities of gait and mobility: Secondary | ICD-10-CM | POA: Diagnosis not present

## 2022-09-24 ENCOUNTER — Ambulatory Visit
Admission: RE | Admit: 2022-09-24 | Discharge: 2022-09-24 | Disposition: A | Discharge status: Home / Self Care | Payer: BC Managed Care – PPO | Source: Ambulatory Visit | Attending: General Surgery | Admitting: General Surgery

## 2022-09-24 DIAGNOSIS — S83241D Other tear of medial meniscus, current injury, right knee, subsequent encounter: Secondary | ICD-10-CM | POA: Diagnosis not present

## 2022-09-24 DIAGNOSIS — Z1239 Encounter for other screening for malignant neoplasm of breast: Secondary | ICD-10-CM | POA: Diagnosis not present

## 2022-09-24 DIAGNOSIS — Z4789 Encounter for other orthopedic aftercare: Secondary | ICD-10-CM | POA: Diagnosis not present

## 2022-09-24 DIAGNOSIS — R9230 Dense breasts, unspecified: Secondary | ICD-10-CM

## 2022-09-24 MED ORDER — GADOPICLENOL 0.5 MMOL/ML IV SOLN
7.0000 mL | Freq: Once | INTRAVENOUS | Status: AC | PRN
  Administered 2022-09-24: 7 mL via INTRAVENOUS

## 2022-10-02 DIAGNOSIS — Z1239 Encounter for other screening for malignant neoplasm of breast: Secondary | ICD-10-CM | POA: Diagnosis not present

## 2022-10-02 DIAGNOSIS — Z803 Family history of malignant neoplasm of breast: Secondary | ICD-10-CM | POA: Diagnosis not present

## 2022-10-02 DIAGNOSIS — R9230 Dense breasts, unspecified: Secondary | ICD-10-CM | POA: Diagnosis not present

## 2022-10-07 DIAGNOSIS — Z09 Encounter for follow-up examination after completed treatment for conditions other than malignant neoplasm: Secondary | ICD-10-CM | POA: Diagnosis not present

## 2022-10-07 DIAGNOSIS — M25561 Pain in right knee: Secondary | ICD-10-CM | POA: Diagnosis not present

## 2022-10-14 DIAGNOSIS — E785 Hyperlipidemia, unspecified: Secondary | ICD-10-CM | POA: Diagnosis not present

## 2022-11-18 DIAGNOSIS — Z4889 Encounter for other specified surgical aftercare: Secondary | ICD-10-CM | POA: Diagnosis not present

## 2022-11-18 DIAGNOSIS — M1711 Unilateral primary osteoarthritis, right knee: Secondary | ICD-10-CM | POA: Diagnosis not present

## 2022-11-18 DIAGNOSIS — Z9889 Other specified postprocedural states: Secondary | ICD-10-CM | POA: Diagnosis not present

## 2022-11-18 DIAGNOSIS — M25561 Pain in right knee: Secondary | ICD-10-CM | POA: Diagnosis not present

## 2022-11-18 DIAGNOSIS — G8929 Other chronic pain: Secondary | ICD-10-CM | POA: Diagnosis not present

## 2022-11-27 DIAGNOSIS — Z1231 Encounter for screening mammogram for malignant neoplasm of breast: Secondary | ICD-10-CM | POA: Diagnosis not present

## 2022-12-08 DIAGNOSIS — M25551 Pain in right hip: Secondary | ICD-10-CM | POA: Diagnosis not present

## 2022-12-10 DIAGNOSIS — M1711 Unilateral primary osteoarthritis, right knee: Secondary | ICD-10-CM | POA: Diagnosis not present

## 2022-12-17 ENCOUNTER — Encounter: Payer: Self-pay | Admitting: Podiatry

## 2022-12-17 ENCOUNTER — Ambulatory Visit: Payer: BC Managed Care – PPO | Admitting: Podiatry

## 2022-12-17 DIAGNOSIS — Q828 Other specified congenital malformations of skin: Secondary | ICD-10-CM | POA: Diagnosis not present

## 2022-12-17 MED ORDER — AMMONIUM LACTATE 12 % EX LOTN: 1.0000 | TOPICAL_LOTION | CUTANEOUS | 0 refills | Status: AC | PRN

## 2022-12-17 NOTE — Progress Notes (Signed)
  Subjective:  Patient ID: Emily Weaver, female    DOB: [birth year],  MRN: [id number]  Chief Complaint  Patient presents with   Plantar Warts    PATIENT STATES SHE BELIEVES SHE HAS A WART AT THE BOTTOM OF HER LF , SHE STATES IT HAS BEEN THERE FOR A COUPLE MONTHS , SHE STATES THAT IT IS PAINFUL WHEN SHE STEPS ON IT SOMETIMES.   PATIENT TAKES MEDICATION PAIN THAT SHE RECEIVED FROM HER KNEE SURGERY     52 y.o. female presents with the above complaint.  Patient presents with left submetatarsal 5 porokeratotic lytic lesion painful to touch is progressive gotten worse worse with ambulation worse with pressure she wanted get it evaluated rest of the HPI as above   Review of Systems: Negative except as noted in the HPI. Denies N/V/F/Ch.  Past Medical History:  Diagnosis Date   Stress incontinence     Current Outpatient Medications:    ammonium lactate (AMLACTIN DAILY) 12 % lotion, Apply 1 Application topically as needed., Disp: 400 g, Rfl: 0   Cyanocobalamin (B-12) 1000 MCG SUBL, Place 1,000 mcg under the tongue daily., Disp: , Rfl:    diclofenac (VOLTAREN) 75 MG EC tablet, Take 75 mg by mouth 2 (two) times daily., Disp: , Rfl:    Melatonin 5 MG TABS, Take 5 mg by mouth at bedtime as needed (SLEEP)., Disp: , Rfl:    metroNIDAZOLE (METROGEL) 0.75 % gel, Apply 1 Application topically 2 (two) times daily., Disp: , Rfl:    Multiple Vitamin (MULTIVITAMIN) tablet, Take 1 tablet by mouth daily., Disp: , Rfl:    Multiple Vitamins-Minerals (ICAPS PO), I-Caps, Disp: , Rfl:    Naproxen Sodium (ALEVE PO), Aleve  prn, Disp: , Rfl:    Probiotic Product (PROBIOTIC PO), Take by mouth., Disp: , Rfl:    valACYclovir (VALTREX) 500 MG tablet, Take 500 mg by mouth as needed., Disp: , Rfl:   Social History   Tobacco Use  Smoking Status Never  Smokeless Tobacco Never    No Known Allergies Objective:  There were no vitals filed for this visit. There is no height or weight on file to calculate  BMI. Constitutional Well developed. Well nourished.  Vascular Dorsalis pedis pulses palpable bilaterally. Posterior tibial pulses palpable bilaterally. Capillary refill normal to all digits.  No cyanosis or clubbing noted. Pedal hair growth normal.  Neurologic Normal speech. Oriented to person, place, and time. Epicritic sensation to light touch grossly present bilaterally.  Dermatologic Left submet 5 porokeratotic lesion with central nucleated core noted.  Pain on palpation to the lesion.  No pinpoint bleeding noted upon debridement  Orthopedic: Normal joint ROM without pain or crepitus bilaterally. No visible deformities. No bony tenderness.   Radiographs: None Assessment:   1. Porokeratosis    Plan:  Patient was evaluated and treated and all questions answered.  Left submetatarsal 5 porokeratosis -All questions and concerns were discussed with the patient in extensive detail given the amount of pain that she is having she will benefit from aggressive debridement of the lesion using chisel blade handle the lesion was debrided down to healthy dry tissue.  No complication noted no.  No bleeding noted -Shoe gear modification discussed  No follow-ups on file.

## 2022-12-19 ENCOUNTER — Other Ambulatory Visit: Payer: Self-pay | Admitting: Medical Genetics

## 2022-12-19 DIAGNOSIS — Z006 Encounter for examination for normal comparison and control in clinical research program: Secondary | ICD-10-CM

## 2023-01-12 DIAGNOSIS — M2550 Pain in unspecified joint: Secondary | ICD-10-CM | POA: Diagnosis not present

## 2023-01-12 DIAGNOSIS — M1711 Unilateral primary osteoarthritis, right knee: Secondary | ICD-10-CM | POA: Diagnosis not present

## 2023-01-12 DIAGNOSIS — Z9889 Other specified postprocedural states: Secondary | ICD-10-CM | POA: Diagnosis not present

## 2023-01-15 DIAGNOSIS — M1711 Unilateral primary osteoarthritis, right knee: Secondary | ICD-10-CM | POA: Diagnosis not present

## 2023-02-09 DIAGNOSIS — M1711 Unilateral primary osteoarthritis, right knee: Secondary | ICD-10-CM | POA: Diagnosis not present

## 2023-02-11 HISTORY — PX: PARTIAL KNEE ARTHROPLASTY: SHX2174

## 2023-02-20 DIAGNOSIS — Z9889 Other specified postprocedural states: Secondary | ICD-10-CM | POA: Diagnosis not present

## 2023-02-20 DIAGNOSIS — M1711 Unilateral primary osteoarthritis, right knee: Secondary | ICD-10-CM | POA: Diagnosis not present

## 2023-02-20 DIAGNOSIS — G8929 Other chronic pain: Secondary | ICD-10-CM | POA: Diagnosis not present

## 2023-02-20 DIAGNOSIS — M25561 Pain in right knee: Secondary | ICD-10-CM | POA: Diagnosis not present

## 2023-02-25 DIAGNOSIS — M1711 Unilateral primary osteoarthritis, right knee: Secondary | ICD-10-CM | POA: Diagnosis not present

## 2023-03-05 DIAGNOSIS — Z96651 Presence of right artificial knee joint: Secondary | ICD-10-CM | POA: Diagnosis not present

## 2023-03-10 DIAGNOSIS — K5792 Diverticulitis of intestine, part unspecified, without perforation or abscess without bleeding: Secondary | ICD-10-CM | POA: Diagnosis not present

## 2023-03-10 DIAGNOSIS — R1032 Left lower quadrant pain: Secondary | ICD-10-CM | POA: Diagnosis not present

## 2023-03-10 DIAGNOSIS — K921 Melena: Secondary | ICD-10-CM | POA: Diagnosis not present

## 2023-03-12 DIAGNOSIS — M25562 Pain in left knee: Secondary | ICD-10-CM | POA: Diagnosis not present

## 2023-03-12 DIAGNOSIS — R739 Hyperglycemia, unspecified: Secondary | ICD-10-CM | POA: Diagnosis not present

## 2023-04-06 DIAGNOSIS — Z96651 Presence of right artificial knee joint: Secondary | ICD-10-CM | POA: Diagnosis not present

## 2023-04-13 DIAGNOSIS — E785 Hyperlipidemia, unspecified: Secondary | ICD-10-CM | POA: Diagnosis not present

## 2023-04-28 ENCOUNTER — Encounter: Payer: Self-pay | Admitting: Gastroenterology

## 2023-04-30 DIAGNOSIS — Z124 Encounter for screening for malignant neoplasm of cervix: Secondary | ICD-10-CM | POA: Diagnosis not present

## 2023-04-30 DIAGNOSIS — Z1151 Encounter for screening for human papillomavirus (HPV): Secondary | ICD-10-CM | POA: Diagnosis not present

## 2023-04-30 DIAGNOSIS — Z6828 Body mass index (BMI) 28.0-28.9, adult: Secondary | ICD-10-CM | POA: Diagnosis not present

## 2023-04-30 DIAGNOSIS — Z01419 Encounter for gynecological examination (general) (routine) without abnormal findings: Secondary | ICD-10-CM | POA: Diagnosis not present

## 2023-05-01 ENCOUNTER — Other Ambulatory Visit: Payer: Self-pay | Admitting: Obstetrics and Gynecology

## 2023-05-01 DIAGNOSIS — E041 Nontoxic single thyroid nodule: Secondary | ICD-10-CM

## 2023-05-04 ENCOUNTER — Encounter: Payer: Self-pay | Admitting: Gastroenterology

## 2023-05-06 ENCOUNTER — Ambulatory Visit
Admission: RE | Admit: 2023-05-06 | Discharge: 2023-05-06 | Disposition: A | Discharge status: Home / Self Care | Source: Ambulatory Visit | Attending: Obstetrics and Gynecology | Admitting: Obstetrics and Gynecology

## 2023-05-06 DIAGNOSIS — E041 Nontoxic single thyroid nodule: Secondary | ICD-10-CM

## 2023-05-06 DIAGNOSIS — R221 Localized swelling, mass and lump, neck: Secondary | ICD-10-CM | POA: Diagnosis not present

## 2023-05-18 DIAGNOSIS — Z96651 Presence of right artificial knee joint: Secondary | ICD-10-CM | POA: Diagnosis not present

## 2023-05-28 ENCOUNTER — Ambulatory Visit: Admitting: Gastroenterology

## 2023-06-10 DIAGNOSIS — Z96651 Presence of right artificial knee joint: Secondary | ICD-10-CM | POA: Diagnosis not present

## 2023-06-10 DIAGNOSIS — M25561 Pain in right knee: Secondary | ICD-10-CM | POA: Diagnosis not present

## 2023-06-10 DIAGNOSIS — G8929 Other chronic pain: Secondary | ICD-10-CM | POA: Diagnosis not present

## 2023-06-10 DIAGNOSIS — R6889 Other general symptoms and signs: Secondary | ICD-10-CM | POA: Diagnosis not present

## 2023-06-21 DIAGNOSIS — Z6827 Body mass index (BMI) 27.0-27.9, adult: Secondary | ICD-10-CM | POA: Diagnosis not present

## 2023-06-21 DIAGNOSIS — H10022 Other mucopurulent conjunctivitis, left eye: Secondary | ICD-10-CM | POA: Diagnosis not present

## 2023-06-23 DIAGNOSIS — G8929 Other chronic pain: Secondary | ICD-10-CM | POA: Diagnosis not present

## 2023-06-23 DIAGNOSIS — M25561 Pain in right knee: Secondary | ICD-10-CM | POA: Diagnosis not present

## 2023-06-23 DIAGNOSIS — Z96651 Presence of right artificial knee joint: Secondary | ICD-10-CM | POA: Diagnosis not present

## 2023-06-29 ENCOUNTER — Encounter: Payer: Self-pay | Admitting: Gastroenterology

## 2023-06-29 ENCOUNTER — Ambulatory Visit: Admitting: Gastroenterology

## 2023-06-29 VITALS — BP 122/64 | HR 89 | Ht 62.25 in | Wt 159.0 lb

## 2023-06-29 DIAGNOSIS — Z1211 Encounter for screening for malignant neoplasm of colon: Secondary | ICD-10-CM

## 2023-06-29 DIAGNOSIS — Z09 Encounter for follow-up examination after completed treatment for conditions other than malignant neoplasm: Secondary | ICD-10-CM

## 2023-06-29 DIAGNOSIS — Z8719 Personal history of other diseases of the digestive system: Secondary | ICD-10-CM

## 2023-06-29 NOTE — Patient Instructions (Signed)
 _______________________________________________________  If your blood pressure at your visit was 140/90 or greater, please contact your primary care physician to follow up on this.  _______________________________________________________  If you are age 55 or older, your body mass index should be between 23-30. Your Body mass index is 28.85 kg/m. If this is out of the aforementioned range listed, please consider follow up with your Primary Care Provider.  If you are age 39 or younger, your body mass index should be between 19-25. Your Body mass index is 28.85 kg/m. If this is out of the aformentioned range listed, please consider follow up with your Primary Care Provider.   ________________________________________________________  The [facility] providers would like to encourage you to use MYCHART to communicate with providers for non-urgent requests or questions.  Due to long hold times on the telephone, sending your provider a message by [facility] may be a faster and more efficient way to get a response.  Please allow 48 business hours for a response.  Please remember that this is for non-urgent requests.  _______________________________________________________  We will contact you closer to November to get your colonoscopy scheduled.  Thank you for entrusting me with your care and choosing [facility].  [place], PA-C

## 2023-06-29 NOTE — Progress Notes (Signed)
 Chief Complaint: screening colonoscopy Primary GI MD: Referred to Dr. General Kenner per referral notes  HPI: Discussed the use of AI scribe software for clinical note transcription with the patient, who gave verbal consent to proceed.  History of Present Illness Emily Weaver "Emily Weaver" is a 53 year old female who presents for evaluation of gastrointestinal symptoms and consideration of a colonoscopy.  Referred to Dr. General Kenner for initial screening colonoscopy by PCP  Labs 03/10/2023 with normal LFTs.  Normal BUN/creatinine.  Hgb 13.7, MCV 98.2.  Ferritin 95.4.  Iron 108, saturation 38%, TIBC 284  In 2021, she experienced severe upper abdominal pain and was diagnosed with diverticulitis after imaging revealed sigmoid diverticulosis and thickening of the mid to distal sigmoid colon. She remained asymptomatic until a recent episode where she noticed blood in her stool, a symptom not present during the initial episode.  During the recent episode, she did not experience pain but had loose stools. She attributes these symptoms to constipation and the use of narcotics, stool softeners, and reduced physical activity following a partial knee replacement in January. No current gastrointestinal symptoms are reported.  She has not undergone a colonoscopy before. She has been taking Benefiber, starting with two teaspoons but reducing to one teaspoon daily due to tolerance.  She denies any family history of colon cancer or polyps. She is planning a cruise in October and has been a Hospital doctor for friends undergoing colonoscopies, indicating familiarity with the procedure.   Past Medical History:  Diagnosis Date   Anemia    Chicken pox    Dense breast    Endometriosis    Hyperglycemia    Hyperlipidemia    Osteoarthritis    Polyarthralgia    Radiculopathy of lumbar region    Stress incontinence    Vitamin B deficiency     Past Surgical History:  Procedure Laterality Date    ADENOIDECTOMY     age 60   CYSTOSCOPY N/A 06/20/2016   Procedure: CYSTOSCOPY;  Surgeon: Percy Bracken, MD;  Location: [facility];  Service: Gynecology;  Laterality: N/A;   IUD REMOVAL N/A 06/20/2016   Procedure: INTRAUTERINE DEVICE (IUD) REMOVAL;  Surgeon: Lavoie, Marie-Lyne, MD;  Location: [facility];  Service: Gynecology;  Laterality: N/A;  BETH TO RNFA confirmed with chassity and kathy on 05/23/16    KNEE CARTILAGE SURGERY Left 11/2018   LIPOMA REMOVAL     RIGHT LEG   lymph node removal     age 89 years old due to cat scratch fever   REFRACTIVE SURGERY     RIGHT KNEE CARTILAGE SURGERY  Right 08/20/2022   ROBOTIC ASSISTED BILATERAL SALPINGO OOPHERECTOMY Bilateral 06/20/2016   Procedure: ROBOTIC ASSISTED LEFT  SALPINGO-OOPHORECTOMY WITH RIGHT SALPINGECTOMY;  Surgeon: Percy Bracken, MD;  Location: [facility];  Service: Gynecology;  Laterality: Bilateral;      With pelvic washings     SCLEROTHERAPY      Current Outpatient Medications  Medication Sig Dispense Refill   Melatonin 5 MG TABS Take 5 mg by mouth at bedtime as needed (SLEEP).     Multiple Vitamins-Minerals (ICAPS PO) I-Caps     Naproxen Sodium (ALEVE PO) Aleve  prn     Probiotic Product (PROBIOTIC PO) Take by mouth.     ammonium lactate  (AMLACTIN DAILY) 12 % lotion Apply 1 Application topically as needed. (Patient not taking: Reported on 06/29/2023) 400 g 0   Cyanocobalamin  (B-12) 1000 MCG SUBL Place 1,000 mcg under the tongue daily. (Patient not taking: Reported on 06/29/2023)  diclofenac (VOLTAREN) 75 MG EC tablet Take 75 mg by mouth 2 (two) times daily. (Patient not taking: Reported on 06/29/2023)     metroNIDAZOLE (METROGEL) 0.75 % gel Apply 1 Application topically 2 (two) times daily. (Patient not taking: Reported on 06/29/2023)     Multiple Vitamin (MULTIVITAMIN) tablet Take 1 tablet by mouth daily. (Patient not taking: Reported on 06/29/2023)     valACYclovir (VALTREX) 500 MG tablet Take 500 mg by mouth as needed.  (Patient not taking: Reported on 06/29/2023)     No current facility-administered medications for this visit.    Allergies as of 06/29/2023   (No Known Allergies)    Family History  Adopted: Yes  Problem Relation Age of Onset   Breast cancer Mother    Colon cancer Neg Hx    Esophageal cancer Neg Hx    Stomach cancer Neg Hx     Social History   Socioeconomic History   Marital status: Married    Spouse name: Not on file   Number of children: 2   Years of education: Not on file   Highest education level: Not on file  Occupational History   Occupation: retired  Tobacco Use   Smoking status: Never   Smokeless tobacco: Never  Vaping Use   Vaping status: Never Used  Substance and Sexual Activity   Alcohol  use: Yes    Comment: occ   Drug use: No   Sexual activity: Yes    Partners: Male    Birth control/protection: Surgical    Comment: btl  Other Topics Concern   Not on file  Social History Narrative   Not on file   Social Drivers of Health   Financial Resource Strain: Not on file  Food Insecurity: No Food Insecurity (10/09/2020)   Received from [facility], [facility]   Hunger Vital Sign    Worried About Running Out of Food in the Last Year: Never true    Ran Out of Food in the Last Year: Never true  Transportation Needs: Not on file  Physical Activity: Not on file  Stress: Not on file  Social Connections: Unknown (06/25/2021)   Received from [facility], [facility]   Social Network    Social Network: Not on file  Intimate Partner Violence: Unknown (05/17/2021)   Received from [employer], [facility]   HITS    Physically Hurt: Not on file    Insult or Talk Down To: Not on file    Threaten Physical Harm: Not on file    Scream or Curse: Not on file    Review of Systems:    Constitutional: No weight loss, fever, chills, weakness or fatigue HEENT: Eyes: No change in vision               Ears, Nose, Throat:  No change in hearing or  congestion Skin: No rash or itching Cardiovascular: No chest pain, chest pressure or palpitations   Respiratory: No SOB or cough Gastrointestinal: See HPI and otherwise negative Genitourinary: No dysuria or change in urinary frequency Neurological: No headache, dizziness or syncope Musculoskeletal: No new muscle or joint pain Hematologic: No bleeding or bruising Psychiatric: No history of depression or anxiety    Physical Exam:  Vital signs: BP 122/64   Pulse 89   Ht 5' 2.25" (1.581 m)   Wt 159 lb (72.1 kg)   BMI 28.85 kg/m   Constitutional: NAD, alert and cooperative Head:  Normocephalic and atraumatic. Eyes:   PEERL, EOMI. No  icterus. Conjunctiva pink. Respiratory: Respirations even and unlabored. Lungs clear to auscultation bilaterally.   No wheezes, crackles, or rhonchi.  Cardiovascular:  Regular rate and rhythm. No peripheral edema, cyanosis or pallor.  Gastrointestinal:  Soft, nondistended, nontender. No rebound or guarding. Normal bowel sounds. No appreciable masses or hepatomegaly. Rectal:  Declines Msk:  Symmetrical without gross deformities. Without edema, no deformity or joint abnormality.  Neurologic:  Alert and  oriented x4;  grossly normal neurologically.  Skin:   Dry and intact without significant lesions or rashes. Psychiatric: Oriented to person, place and time. Demonstrates good judgement and reason without abnormal affect or behaviors.  RELEVANT LABS AND IMAGING: CBC    Component Value Date/Time   WBC 9.0 06/10/2016 1018   RBC 4.18 06/10/2016 1018   HGB 13.4 06/10/2016 1018   HCT 39.5 06/10/2016 1018   PLT 330 06/10/2016 1018   MCV 94.5 06/10/2016 1018   MCH 32.1 06/10/2016 1018   MCHC 33.9 06/10/2016 1018   RDW 13.8 06/10/2016 1018    CMP  No results found for: "NA", "K", "CL", "CO2", "GLUCOSE", "BUN", "CREATININE", "CALCIUM", "PROT", "ALBUMIN", "AST", "ALT", "ALKPHOS", "BILITOT", "GFRNONAA", "GFRAA"   Assessment/Plan:   History of  diverticulitis Screening for colon cancer History of documented diverticulitis per CT scan in 2021 treated with antibiotics and assumed diverticulitis flare earlier this year.  No previous colonoscopy.  No GI symptoms today.  Doing well on high-fiber.  Normal CBC and CMP with no anemia - Colonoscopy for further evaluation - I thoroughly discussed the procedure with the patient (at bedside) to include nature of the procedure, alternatives, benefits, and risks (including but not limited to bleeding, infection, perforation, anesthesia/cardiac pulmonary complications).  Patient verbalized understanding and gave verbal consent to proceed with procedure. - Discussed diverticulosis and diverticulitis with patient provided patient education handouts  Of note patient would like to wait until November to get her colonoscopy as she has many things schedule between now and then.  Will put her on November recall list.  Discussed if recurrence of diverticulitis prior to procedure please let us  know   G[facility] 06/29/2023, 3:42 PM  Cc: Aldo Hun, MD

## 2023-06-30 DIAGNOSIS — L570 Actinic keratosis: Secondary | ICD-10-CM | POA: Diagnosis not present

## 2023-06-30 NOTE — Progress Notes (Signed)
 Agree with assessment and plan as outlined.

## 2023-07-29 DIAGNOSIS — L309 Dermatitis, unspecified: Secondary | ICD-10-CM | POA: Diagnosis not present

## 2023-07-29 DIAGNOSIS — L719 Rosacea, unspecified: Secondary | ICD-10-CM | POA: Diagnosis not present

## 2023-07-29 DIAGNOSIS — L570 Actinic keratosis: Secondary | ICD-10-CM | POA: Diagnosis not present

## 2023-07-29 DIAGNOSIS — Z8619 Personal history of other infectious and parasitic diseases: Secondary | ICD-10-CM | POA: Diagnosis not present

## 2023-11-02 DIAGNOSIS — D649 Anemia, unspecified: Secondary | ICD-10-CM | POA: Diagnosis not present

## 2023-11-02 DIAGNOSIS — Z780 Asymptomatic menopausal state: Secondary | ICD-10-CM | POA: Diagnosis not present

## 2023-11-02 DIAGNOSIS — E538 Deficiency of other specified B group vitamins: Secondary | ICD-10-CM | POA: Diagnosis not present

## 2023-11-02 DIAGNOSIS — E785 Hyperlipidemia, unspecified: Secondary | ICD-10-CM | POA: Diagnosis not present

## 2023-11-03 ENCOUNTER — Encounter: Payer: Self-pay | Admitting: Gastroenterology

## 2023-11-04 DIAGNOSIS — L821 Other seborrheic keratosis: Secondary | ICD-10-CM | POA: Diagnosis not present

## 2023-11-04 DIAGNOSIS — D0461 Carcinoma in situ of skin of right upper limb, including shoulder: Secondary | ICD-10-CM | POA: Diagnosis not present

## 2023-11-04 DIAGNOSIS — D485 Neoplasm of uncertain behavior of skin: Secondary | ICD-10-CM | POA: Diagnosis not present

## 2023-11-04 DIAGNOSIS — D229 Melanocytic nevi, unspecified: Secondary | ICD-10-CM | POA: Diagnosis not present

## 2023-11-04 DIAGNOSIS — L814 Other melanin hyperpigmentation: Secondary | ICD-10-CM | POA: Diagnosis not present

## 2023-11-04 DIAGNOSIS — L578 Other skin changes due to chronic exposure to nonionizing radiation: Secondary | ICD-10-CM | POA: Diagnosis not present

## 2023-11-09 DIAGNOSIS — Z1389 Encounter for screening for other disorder: Secondary | ICD-10-CM | POA: Diagnosis not present

## 2023-11-09 DIAGNOSIS — Z0000 Encounter for general adult medical examination without abnormal findings: Secondary | ICD-10-CM | POA: Diagnosis not present

## 2023-11-09 DIAGNOSIS — Z23 Encounter for immunization: Secondary | ICD-10-CM | POA: Diagnosis not present

## 2023-11-09 DIAGNOSIS — E538 Deficiency of other specified B group vitamins: Secondary | ICD-10-CM | POA: Diagnosis not present

## 2023-11-09 DIAGNOSIS — R82998 Other abnormal findings in urine: Secondary | ICD-10-CM | POA: Diagnosis not present

## 2023-11-09 DIAGNOSIS — Z1331 Encounter for screening for depression: Secondary | ICD-10-CM | POA: Diagnosis not present

## 2023-12-03 ENCOUNTER — Other Ambulatory Visit: Payer: Self-pay | Admitting: Medical Genetics

## 2023-12-03 DIAGNOSIS — Z1231 Encounter for screening mammogram for malignant neoplasm of breast: Secondary | ICD-10-CM | POA: Diagnosis not present

## 2023-12-03 DIAGNOSIS — Z006 Encounter for examination for normal comparison and control in clinical research program: Secondary | ICD-10-CM

## 2023-12-09 DIAGNOSIS — C44622 Squamous cell carcinoma of skin of right upper limb, including shoulder: Secondary | ICD-10-CM | POA: Diagnosis not present

## 2023-12-09 DIAGNOSIS — R928 Other abnormal and inconclusive findings on diagnostic imaging of breast: Secondary | ICD-10-CM | POA: Diagnosis not present

## 2023-12-09 DIAGNOSIS — N6489 Other specified disorders of breast: Secondary | ICD-10-CM | POA: Diagnosis not present

## 2023-12-29 LAB — GENECONNECT MOLECULAR SCREEN: Genetic Analysis Overall Interpretation: NEGATIVE

## 2024-01-12 ENCOUNTER — Ambulatory Visit

## 2024-01-12 VITALS — Ht 63.0 in | Wt 160.0 lb

## 2024-01-12 DIAGNOSIS — Z1211 Encounter for screening for malignant neoplasm of colon: Secondary | ICD-10-CM

## 2024-01-12 MED ORDER — NA SULFATE-K SULFATE-MG SULF 17.5-3.13-1.6 GM/177ML PO SOLN: 1.0000 | Freq: Once | ORAL | 0 refills | Status: AC

## 2024-01-12 NOTE — Progress Notes (Signed)

## 2024-01-19 ENCOUNTER — Encounter: Payer: Self-pay | Admitting: Gastroenterology

## 2024-01-26 ENCOUNTER — Ambulatory Visit: Admitting: Gastroenterology

## 2024-01-26 ENCOUNTER — Encounter: Payer: Self-pay | Admitting: Gastroenterology

## 2024-01-26 VITALS — BP 110/64 | HR 84 | Temp 98.0°F | Resp 16 | Ht 63.0 in | Wt 160.0 lb

## 2024-01-26 DIAGNOSIS — Z8719 Personal history of other diseases of the digestive system: Secondary | ICD-10-CM

## 2024-01-26 DIAGNOSIS — K648 Other hemorrhoids: Secondary | ICD-10-CM | POA: Diagnosis not present

## 2024-01-26 DIAGNOSIS — Z1211 Encounter for screening for malignant neoplasm of colon: Secondary | ICD-10-CM

## 2024-01-26 DIAGNOSIS — D120 Benign neoplasm of cecum: Secondary | ICD-10-CM

## 2024-01-26 DIAGNOSIS — K635 Polyp of colon: Secondary | ICD-10-CM | POA: Diagnosis not present

## 2024-01-26 DIAGNOSIS — K5730 Diverticulosis of large intestine without perforation or abscess without bleeding: Secondary | ICD-10-CM | POA: Diagnosis not present

## 2024-01-26 MED ORDER — SODIUM CHLORIDE 0.9 % IV SOLN: 500.0000 mL | Freq: Once | INTRAVENOUS | Status: DC

## 2024-01-26 NOTE — Progress Notes (Signed)
 Called to room to assist during endoscopic procedure.  Patient ID and intended procedure confirmed with present staff. Received instructions for my participation in the procedure from the performing physician.

## 2024-01-26 NOTE — Progress Notes (Signed)
 Pt's states no medical or surgical changes since previsit or office visit.

## 2024-01-26 NOTE — Progress Notes (Signed)
 [name] History and Physical   Primary Care Physician:  Emily Anes, MD   Reason for Procedure:   History of diverticulitis, colon cancer screening  Plan:    colonoscopy     HPI: Emily Weaver is a 53 y.o. female  here for first time colonoscopy - she has had diverticulitis in the past, but not yet had colonoscopy.    Patient denies any bowel symptoms at this time recently. She thinks she has had 2 prior episodes of diverticulitis. No family history of colon cancer known. Otherwise feels well without any cardiopulmonary symptoms.   I have discussed risks / benefits of anesthesia and endoscopic procedure with Emily Weaver and they wish to proceed with the exams as outlined today.   The patient was provided an opportunity to ask questions and all were answered. The patient agreed with the plan.    Past Medical History:  Diagnosis Date   Anemia    Chicken pox    Dense breast    Endometriosis    Hyperglycemia    Hyperlipidemia    Osteoarthritis    Polyarthralgia    Radiculopathy of lumbar region    Stress incontinence    Vitamin B deficiency     Past Surgical History:  Procedure Laterality Date   ADENOIDECTOMY     age 33   Basal cell extraction Right    Right arm   CYSTOSCOPY N/A 06/20/2016   Procedure: CYSTOSCOPY;  Surgeon: Emily Blonder, MD;  Location: [facility];  Service: Gynecology;  Laterality: N/A;   IUD REMOVAL N/A 06/20/2016   Procedure: INTRAUTERINE DEVICE (IUD) REMOVAL;  Surgeon: Lavoie, Marie-Lyne, MD;  Location: [facility];  Service: Gynecology;  Laterality: N/A;  Emily Weaver TO RNFA confirmed with Emily Weaver and Emily Weaver on 05/23/16    KNEE CARTILAGE SURGERY Left 11/2018   LIPOMA REMOVAL     RIGHT LEG   lymph node removal     age 15 years old due to cat scratch fever   PARTIAL KNEE ARTHROPLASTY Right 2025   REFRACTIVE SURGERY     RIGHT KNEE CARTILAGE SURGERY  Right 08/20/2022   ROBOTIC ASSISTED BILATERAL SALPINGO  OOPHERECTOMY Bilateral 06/20/2016   Procedure: ROBOTIC ASSISTED LEFT  SALPINGO-OOPHORECTOMY WITH RIGHT SALPINGECTOMY;  Surgeon: Emily Blonder, MD;  Location: [facility];  Service: Gynecology;  Laterality: Bilateral;      With pelvic washings     SCLEROTHERAPY      Prior to Admission medications  Medication Sig Start Date End Date Taking? Authorizing Provider  Cyanocobalamin  (B-12) 1000 MCG SUBL Place 1,000 mcg under the tongue daily.   Yes [provider]  Multiple Vitamin (MULTIVITAMIN) tablet Take 1 tablet by mouth daily.   Yes [provider]  Multiple Vitamins-Minerals (ICAPS PO) I-Caps   Yes [provider]  Probiotic Product (PROBIOTIC PO) Take by mouth.   Yes [provider]  ammonium lactate  (AMLACTIN DAILY) 12 % lotion Apply 1 Application topically as needed. Patient not taking: Reported on 01/12/2024 12/17/22   Emily Weaver, DPM  diclofenac (VOLTAREN) 75 MG EC tablet Take 75 mg by mouth 2 (two) times daily. Patient not taking: Reported on 06/29/2023    [provider]  Melatonin 5 MG TABS Take 5 mg by mouth at bedtime as needed (SLEEP). Patient not taking: No sig reported    [provider]  metroNIDAZOLE (METROGEL) 0.75 % gel Apply 1 Application topically 2 (two) times daily. Patient not taking: Reported on 01/12/2024 03/10/22   [provider]  Naproxen Sodium (ALEVE PO) Aleve  prn Patient not taking: Reported on 01/12/2024    [provider]  valACYclovir (VALTREX) 500 MG tablet Take 500 mg by mouth as needed. 11/18/19   [provider]    Current Outpatient Medications  Medication Sig Dispense Refill   Cyanocobalamin  (B-12) 1000 MCG SUBL Place 1,000 mcg under the tongue daily.     Multiple Vitamin (MULTIVITAMIN) tablet Take 1 tablet by mouth daily.     Multiple Vitamins-Minerals (ICAPS PO) I-Caps     Probiotic Product (PROBIOTIC PO) Take by mouth.     ammonium lactate  (AMLACTIN DAILY) 12 %  lotion Apply 1 Application topically as needed. (Patient not taking: Reported on 01/12/2024) 400 g 0   diclofenac (VOLTAREN) 75 MG EC tablet Take 75 mg by mouth 2 (two) times daily. (Patient not taking: Reported on 06/29/2023)     Melatonin 5 MG TABS Take 5 mg by mouth at bedtime as needed (SLEEP). (Patient not taking: No sig reported)     metroNIDAZOLE (METROGEL) 0.75 % gel Apply 1 Application topically 2 (two) times daily. (Patient not taking: Reported on 01/12/2024)     Naproxen Sodium (ALEVE PO) Aleve  prn (Patient not taking: Reported on 01/12/2024)     valACYclovir (VALTREX) 500 MG tablet Take 500 mg by mouth as needed.     Current Facility-Administered Medications  Medication Dose Route Frequency Provider Last Rate Last Admin   0.9 %  sodium chloride  infusion  500 mL Intravenous Once Emily Weaver, Emily SQUIBB, MD        Allergies as of 01/26/2024   (No Known Allergies)    Family History  Adopted: Yes  Problem Relation Age of Onset   Breast cancer Mother    Colon cancer Neg Hx    Esophageal cancer Neg Hx    Stomach cancer Neg Hx    Rectal cancer Neg Hx     Social History   Socioeconomic History   Marital status: Married    Spouse name: Not on file   Number of children: 2   Years of education: Not on file   Highest education level: Not on file  Occupational History   Occupation: retired  Tobacco Use   Smoking status: Never   Smokeless tobacco: Never  Vaping Use   Vaping status: Never Used  Substance and Sexual Activity   Alcohol  use: Yes    Comment: occ   Drug use: No   Sexual activity: Yes    Partners: Male    Birth control/protection: Surgical    Comment: btl  Other Topics Concern   Not on file  Social History Narrative   Not on file   Social Drivers of Health   Tobacco Use: Low Risk (01/26/2024)   Patient History    Smoking Tobacco Use: Never    Smokeless Tobacco Use: Never    Passive Exposure: Not on file  Financial Resource Strain: Not on file  Food  Insecurity: Not on file  Transportation Needs: Not on file  Physical Activity: Not on file  Stress: Not on file  Social Connections: Unknown (06/25/2021)   Received from [facility]   Social Network    Social Network: Not on file  Intimate Partner Violence: Unknown (05/17/2021)   Received from [facility]   HITS    Physically Hurt: Not on file    Insult or Talk Down To: Not on file    Threaten Physical Harm: Not on file    Scream or Curse: Not  on file  Depression (EYV7-0): Not on file  Alcohol  Screen: Not on file  Housing: Not on file  Utilities: Not on file  Health Literacy: Not on file    Review of Systems: All other review of systems negative except as mentioned in the HPI.  Physical Exam: Vital signs BP 126/74   Pulse 92   Temp 98 F (36.7 C) (Temporal)   Ht 5' 3 (1.6 m)   Wt 160 lb (72.6 kg)   SpO2 97%   BMI 28.34 kg/m   General:   Alert,  Well-developed, pleasant and cooperative in NAD Lungs:  Clear throughout to auscultation.   Heart:  Regular rate and rhythm Abdomen:  Soft, nontender and nondistended.   Neuro/Psych:  Alert and cooperative. Normal mood and affect. A and O x 3  Marcey Naval, MD [facility]

## 2024-01-26 NOTE — Patient Instructions (Signed)

## 2024-01-26 NOTE — Op Note (Signed)
 [facility] Patient Name: Emily Weaver Procedure Date: 01/26/2024 7:21 AM MRN: [id number] Endoscopist: Elspeth P. Leigh , MD, [id number] Age: 53 Referring MD:  Date of Birth: 1971/01/03 Gender: Female Account #: [id number] Procedure:                Colonoscopy Indications:              Screening for colorectal malignant neoplasm, This                            is the patient's first colonoscopy. History of                            diverticulitis in the past Medicines:                Monitored Anesthesia Care Procedure:                Pre-Anesthesia Assessment:                           - Prior to the procedure, a History and Physical                            was performed, and patient medications and                            allergies were reviewed. The patient's tolerance of                            previous anesthesia was also reviewed. The risks                            and benefits of the procedure and the sedation                            options and risks were discussed with the patient.                            All questions were answered, and informed consent                            was obtained. Prior Anticoagulants: The patient has                            taken no anticoagulant or antiplatelet agents. ASA                            Grade Assessment: II - A patient with mild systemic                            disease. After reviewing the risks and benefits,                            the patient was deemed in satisfactory condition to  undergo the procedure.                           After obtaining informed consent, the colonoscope                            was passed under direct vision. Throughout the                            procedure, the patient's blood pressure, pulse, and                            oxygen saturations were monitored continuously. The                            Olympus Scope SN:  [id number] was introduced through                            the anus and advanced to the the cecum, identified                            by appendiceal orifice and ileocecal valve. The                            colonoscopy was performed without difficulty. The                            patient tolerated the procedure well. The quality                            of the bowel preparation was adequate. The                            ileocecal valve, appendiceal orifice, and rectum                            were photographed. Scope In: 7:57:58 AM Scope Out: 8:17:24 AM Scope Withdrawal Time: 0 hours 15 minutes 5 seconds  Total Procedure Duration: 0 hours 19 minutes 26 seconds  Findings:                 The perianal and digital rectal examinations were                            normal.                           A 3 mm polyp was found in the cecum. The polyp was                            sessile. The polyp was removed with a cold snare.                            Resection and retrieval were complete.  Many diverticula were found in the entire colon.                            Severe in the left colon.                           Internal hemorrhoids were found during retroflexion.                           The exam was otherwise without abnormality. Complications:            No immediate complications. Estimated blood loss:                            Minimal. Estimated Blood Loss:     Estimated blood loss was minimal. Impression:               - One 3 mm polyp in the cecum, removed with a cold                            snare. Resected and retrieved.                           - Diverticulosis in the entire examined colon.                           - Internal hemorrhoids.                           - The examination was otherwise normal. Recommendation:           - Patient has a contact number available for                            emergencies. The signs and  symptoms of potential                            delayed complications were discussed with the                            patient. Return to normal activities tomorrow.                            Written discharge instructions were provided to the                            patient.                           - Resume previous diet.                           - Continue present medications.                           - Await pathology results. Elspeth P. Zionah Criswell, MD 01/26/2024 8:25:02 AM This report has been signed electronically.

## 2024-01-26 NOTE — Progress Notes (Signed)
 Vss nad trans to pacu

## 2024-01-27 ENCOUNTER — Telehealth: Payer: Self-pay | Admitting: Lactation Services

## 2024-01-27 NOTE — Telephone Encounter (Signed)
°  Follow up Call-     01/26/2024    7:14 AM  Call back number  Post procedure Call Back phone  # [phone]  Permission to leave phone message Yes     Patient questions:  Do you have a fever, pain , or abdominal swelling? No. Pain Score  0 *  Have you tolerated food without any problems? Yes.    Have you been able to return to your normal activities? Yes.    Do you have any questions about your discharge instructions: Diet   No. Medications  No. Follow up visit  No.  Do you have questions or concerns about your Care? No.  Actions: * If pain score is 4 or above: No action needed, pain <4.

## 2024-01-28 ENCOUNTER — Ambulatory Visit: Payer: Self-pay | Admitting: Gastroenterology

## 2024-01-28 LAB — SURGICAL PATHOLOGY

## 2024-03-11 ENCOUNTER — Ambulatory Visit: Admitting: Podiatry

## 2024-03-11 DIAGNOSIS — Q666 Other congenital valgus deformities of feet: Secondary | ICD-10-CM | POA: Diagnosis not present

## 2024-03-11 NOTE — Progress Notes (Signed)
 "  Subjective:  Patient ID: Emily Weaver, female    DOB: Jun 21, 1970,  MRN: [id number]  Chief Complaint  Patient presents with   Foot Pain    Pt stated that she has a neuroma that has been causing her some pain on the left foot she has some numbness in 3 of her toes on the right foot     54 y.o. female presents with the above complaint.  Patient presents with right midfoot pain.  She states that has been causing her a lot of discomfort wanted to get it evaluated.  She does not wear any orthotics she has been wearing them so no shoes which has been causing more more discomfort with time.  Pain scale is 5 out of 10 dull aching nature with ambulation worse with pressure.   Review of Systems: Negative except as noted in the HPI. Denies N/V/F/Ch.  Past Medical History:  Diagnosis Date   Anemia    Chicken pox    Dense breast    Endometriosis    Hyperglycemia    Hyperlipidemia    Osteoarthritis    Polyarthralgia    Radiculopathy of lumbar region    Stress incontinence    Vitamin B deficiency    Current Medications[1]  Tobacco Use History[2]  Allergies[3] Objective:  There were no vitals filed for this visit. There is no height or weight on file to calculate BMI. Constitutional Well developed. Well nourished.  Vascular Dorsalis pedis pulses palpable bilaterally. Posterior tibial pulses palpable bilaterally. Capillary refill normal to all digits.  No cyanosis or clubbing noted. Pedal hair growth normal.  Neurologic Normal speech. Oriented to person, place, and time. Epicritic sensation to light touch grossly present bilaterally.  Dermatologic Nails well groomed and normal in appearance. No open wounds. No skin lesions.  Orthopedic: Gait examination shows pes planovalgus with calcaneovalgus to many toes as partially will require the arch with dorsiflexion of the hallux unable to perform single or double heel raise.   Radiographs: None Assessment:   1. Pes  planovalgus    Plan:  Patient was evaluated and treated and all questions answered.  Pes planovalgus/foot deformity with underlying arch pain -I explained to patient the etiology of pes planovalgus and relationship with heel pain/arch pain and various treatment options were discussed.  Given patient foot structure in the setting of heel pain/arch pain I believe patient will benefit from custom-made orthotics to help control the hindfoot motion support the arch of the foot and take the stress away from arches.  Patient agrees with the plan like to proceed with orthotics -Patient was casted for orthotics - If there is no improvement we will discuss steroid injection in the future.  -  No follow-ups on file.     [1]  Current Outpatient Medications:    ammonium lactate  (AMLACTIN DAILY) 12 % lotion, Apply 1 Application topically as needed. (Patient not taking: Reported on 01/12/2024), Disp: 400 g, Rfl: 0   Cyanocobalamin  (B-12) 1000 MCG SUBL, Place 1,000 mcg under the tongue daily., Disp: , Rfl:    diclofenac (VOLTAREN) 75 MG EC tablet, Take 75 mg by mouth 2 (two) times daily. (Patient not taking: Reported on 06/29/2023), Disp: , Rfl:    Melatonin 5 MG TABS, Take 5 mg by mouth at bedtime as needed (SLEEP). (Patient not taking: No sig reported), Disp: , Rfl:    metroNIDAZOLE (METROGEL) 0.75 % gel, Apply 1 Application topically 2 (two) times daily. (Patient not taking: Reported on 01/12/2024), Disp: ,  Rfl:    Multiple Vitamin (MULTIVITAMIN) tablet, Take 1 tablet by mouth daily., Disp: , Rfl:    Multiple Vitamins-Minerals (ICAPS PO), I-Caps, Disp: , Rfl:    Naproxen Sodium (ALEVE PO), Aleve  prn (Patient not taking: Reported on 01/12/2024), Disp: , Rfl:    Probiotic Product (PROBIOTIC PO), Take by mouth., Disp: , Rfl:    valACYclovir (VALTREX) 500 MG tablet, Take 500 mg by mouth as needed., Disp: , Rfl:  [2]  Social History Tobacco Use  Smoking Status Never  Smokeless Tobacco Never  [3] No Known  Allergies  "
